# Patient Record
Sex: Female | Born: 1965
Health system: Southern US, Community
[De-identification: ages and names within clinical notes are randomized; demographics above are authoritative.]

## PROBLEM LIST (undated history)

## (undated) DIAGNOSIS — K219 Gastro-esophageal reflux disease without esophagitis: Secondary | ICD-10-CM

## (undated) DIAGNOSIS — Z1379 Encounter for other screening for genetic and chromosomal anomalies: Principal | ICD-10-CM

## (undated) DIAGNOSIS — E079 Disorder of thyroid, unspecified: Secondary | ICD-10-CM

## (undated) DIAGNOSIS — N6310 Unspecified lump in the right breast, unspecified quadrant: Secondary | ICD-10-CM

## (undated) DIAGNOSIS — E039 Hypothyroidism, unspecified: Secondary | ICD-10-CM

## (undated) HISTORY — PX: BREAST SURGERY: SHX581

## (undated) HISTORY — DX: Encounter for other screening for genetic and chromosomal anomalies: Z13.79

## (undated) HISTORY — PX: BREAST EXCISIONAL BIOPSY: SUR124

## (undated) HISTORY — DX: Disorder of thyroid, unspecified: E07.9

## (undated) HISTORY — PX: BREAST BIOPSY: SHX20

---

## 1999-06-01 ENCOUNTER — Encounter: Payer: Self-pay | Admitting: Obstetrics and Gynecology

## 1999-06-01 ENCOUNTER — Ambulatory Visit (HOSPITAL_COMMUNITY): Admission: RE | Admit: 1999-06-01 | Discharge: 1999-06-01 | Payer: Self-pay | Admitting: Obstetrics and Gynecology

## 1999-11-01 ENCOUNTER — Other Ambulatory Visit: Admission: RE | Admit: 1999-11-01 | Discharge: 1999-11-01 | Payer: Self-pay | Admitting: Obstetrics and Gynecology

## 2000-02-19 ENCOUNTER — Inpatient Hospital Stay (HOSPITAL_COMMUNITY): Admission: AD | Admit: 2000-02-19 | Discharge: 2000-02-19 | Payer: Self-pay | Admitting: Obstetrics and Gynecology

## 2000-03-02 ENCOUNTER — Ambulatory Visit (HOSPITAL_COMMUNITY): Admission: RE | Admit: 2000-03-02 | Discharge: 2000-03-02 | Payer: Self-pay | Admitting: Obstetrics and Gynecology

## 2000-05-19 ENCOUNTER — Inpatient Hospital Stay (HOSPITAL_COMMUNITY): Admission: AD | Admit: 2000-05-19 | Discharge: 2000-05-21 | Payer: Self-pay | Admitting: Obstetrics and Gynecology

## 2000-06-26 ENCOUNTER — Other Ambulatory Visit: Admission: RE | Admit: 2000-06-26 | Discharge: 2000-06-26 | Payer: Self-pay | Admitting: Obstetrics and Gynecology

## 2001-08-27 ENCOUNTER — Other Ambulatory Visit: Admission: RE | Admit: 2001-08-27 | Discharge: 2001-08-27 | Payer: Self-pay | Admitting: Obstetrics and Gynecology

## 2002-09-09 ENCOUNTER — Other Ambulatory Visit: Admission: RE | Admit: 2002-09-09 | Discharge: 2002-09-09 | Payer: Self-pay | Admitting: Obstetrics and Gynecology

## 2002-10-24 ENCOUNTER — Encounter: Admission: RE | Admit: 2002-10-24 | Discharge: 2002-10-24 | Payer: Self-pay | Admitting: Obstetrics and Gynecology

## 2002-10-24 ENCOUNTER — Encounter: Payer: Self-pay | Admitting: Obstetrics and Gynecology

## 2003-05-26 ENCOUNTER — Encounter: Admission: RE | Admit: 2003-05-26 | Discharge: 2003-05-26 | Payer: Self-pay | Admitting: Obstetrics and Gynecology

## 2003-05-26 ENCOUNTER — Encounter: Payer: Self-pay | Admitting: Obstetrics and Gynecology

## 2003-09-15 ENCOUNTER — Other Ambulatory Visit: Admission: RE | Admit: 2003-09-15 | Discharge: 2003-09-15 | Payer: Self-pay | Admitting: Obstetrics and Gynecology

## 2003-10-22 ENCOUNTER — Emergency Department (HOSPITAL_COMMUNITY): Admission: AD | Admit: 2003-10-22 | Discharge: 2003-10-22 | Payer: Self-pay | Admitting: Family Medicine

## 2003-12-22 ENCOUNTER — Encounter: Admission: RE | Admit: 2003-12-22 | Discharge: 2003-12-22 | Payer: Self-pay | Admitting: Obstetrics and Gynecology

## 2004-09-15 ENCOUNTER — Other Ambulatory Visit: Admission: RE | Admit: 2004-09-15 | Discharge: 2004-09-15 | Payer: Self-pay | Admitting: Obstetrics and Gynecology

## 2005-09-21 ENCOUNTER — Other Ambulatory Visit: Admission: RE | Admit: 2005-09-21 | Discharge: 2005-09-21 | Payer: Self-pay | Admitting: Obstetrics and Gynecology

## 2005-10-11 ENCOUNTER — Ambulatory Visit: Payer: Self-pay | Admitting: Internal Medicine

## 2006-06-02 ENCOUNTER — Encounter: Admission: RE | Admit: 2006-06-02 | Discharge: 2006-06-02 | Payer: Self-pay | Admitting: Obstetrics and Gynecology

## 2006-11-23 ENCOUNTER — Other Ambulatory Visit: Admission: RE | Admit: 2006-11-23 | Discharge: 2006-11-23 | Payer: Self-pay | Admitting: Obstetrics and Gynecology

## 2007-02-02 ENCOUNTER — Emergency Department (HOSPITAL_COMMUNITY): Admission: EM | Admit: 2007-02-02 | Discharge: 2007-02-02 | Payer: Self-pay | Admitting: Emergency Medicine

## 2007-06-18 ENCOUNTER — Ambulatory Visit: Payer: Self-pay | Admitting: Internal Medicine

## 2007-06-18 DIAGNOSIS — J019 Acute sinusitis, unspecified: Secondary | ICD-10-CM | POA: Insufficient documentation

## 2007-07-04 ENCOUNTER — Encounter: Admission: RE | Admit: 2007-07-04 | Discharge: 2007-07-04 | Payer: Self-pay | Admitting: Obstetrics and Gynecology

## 2007-10-06 ENCOUNTER — Emergency Department (HOSPITAL_COMMUNITY): Admission: EM | Admit: 2007-10-06 | Discharge: 2007-10-06 | Payer: Self-pay | Admitting: Emergency Medicine

## 2007-11-27 ENCOUNTER — Other Ambulatory Visit: Admission: RE | Admit: 2007-11-27 | Discharge: 2007-11-27 | Payer: Self-pay | Admitting: Obstetrics and Gynecology

## 2008-03-19 ENCOUNTER — Ambulatory Visit: Payer: Self-pay | Admitting: Internal Medicine

## 2008-03-19 DIAGNOSIS — R51 Headache: Secondary | ICD-10-CM | POA: Insufficient documentation

## 2008-03-19 DIAGNOSIS — R519 Headache, unspecified: Secondary | ICD-10-CM | POA: Insufficient documentation

## 2008-03-19 DIAGNOSIS — G4700 Insomnia, unspecified: Secondary | ICD-10-CM | POA: Insufficient documentation

## 2008-12-04 ENCOUNTER — Other Ambulatory Visit: Admission: RE | Admit: 2008-12-04 | Discharge: 2008-12-04 | Payer: Self-pay | Admitting: Obstetrics and Gynecology

## 2009-10-05 ENCOUNTER — Ambulatory Visit: Payer: Self-pay | Admitting: Internal Medicine

## 2009-10-05 DIAGNOSIS — J069 Acute upper respiratory infection, unspecified: Secondary | ICD-10-CM | POA: Insufficient documentation

## 2009-12-10 ENCOUNTER — Other Ambulatory Visit: Admission: RE | Admit: 2009-12-10 | Discharge: 2009-12-10 | Payer: Self-pay | Admitting: Obstetrics and Gynecology

## 2009-12-20 HISTORY — PX: OTHER SURGICAL HISTORY: SHX169

## 2010-04-30 ENCOUNTER — Encounter: Admission: RE | Admit: 2010-04-30 | Discharge: 2010-04-30 | Payer: Self-pay | Admitting: Obstetrics and Gynecology

## 2010-09-12 ENCOUNTER — Encounter: Payer: Self-pay | Admitting: Obstetrics and Gynecology

## 2010-09-23 NOTE — Assessment & Plan Note (Signed)
Summary: ?sinus inf/njr   Vital Signs:  Patient Profile:   45 Years Old Female Weight:      170 pounds Temp:     98.7 degrees F oral Pulse rate:   72 / minute BP sitting:   110 / 80  (right arm) Cuff size:   regular  Vitals Entered By: Romualdo Bolk, CMA (June 18, 2007 10:40 AM)                 Chief Complaint:  Sinus pressure and coughing and congestion x 1 week.  History of Present Illness: Christine Velez comes in today for above reason.   head cold that persists and worried about  getting bronchitis and pnuemonia.   No with face pressure continuing.   No fever.  Feels bad in the chest.  symptom about 10days.   No hx of asthma but hx of prolonged cough with uri inthe fall.  No cp or sob . Has face pressure and feels bad.  Worried about weight gain  had normal tsh at gyne  exercises  Current Allergies (reviewed today): No known allergies    Social History:    Reviewed history from 05/29/2007 and no changes required:       Married       Never Smoked       Alcohol use-yes       Drug use-no       Regular exercise-no    Review of Systems  The patient denies fever, chest pain, and dyspnea on exhertion.     Physical Exam  General:     alert, well-developed, well-nourished, and well-hydrated.   Eyes:     no redness Ears:     R ear normal and L ear normal.   Nose:     mucosal erythema.  cobblestining and some crusting face minimally tender. maxilla Mouth:     pharynx pink and moist.   Neck:     No deformities, masses, or tenderness noted. Lungs:     Normal respiratory effort, chest expands symmetrically. Lungs are clear to auscultation, no crackles or wheezes. Cervical Nodes:     No lymphadenopathy noted    Impression & Recommendations:  Problem # 1:  SINUSITIS- ACUTE-NOS (ICD-461.9) viral vs bacterial. discussed dx of bronchitis is a non specific dx and does not direct tretment. no evidence of pneumonia at present.  Rec pseudophedrine decong  with saline and can add antibiotic. Expect cought o continue for a while but cancall if getting worse. Her updated medication list for this problem includes:    Cefdinir 300 Mg Caps (Cefdinir) .Marland Kitchen... 1 by mouth two times a day  for sinusitis   Problem # 2:  concern about weight gain has normal bmi  reviewed  basic principals of weight maintenancee as one ages.  Complete Medication List: 1)  Loestrin Fe 1/20 1-20 Mg-mcg Tabs (Norethin ace-eth estrad-fe) 2)  Cefdinir 300 Mg Caps (Cefdinir) .Marland Kitchen.. 1 by mouth two times a day  for sinusitis     Prescriptions: CEFDINIR 300 MG  CAPS (CEFDINIR) 1 by mouth two times a day  for sinusitis  #20 x 0   Entered and Authorized by:   Madelin Headings MD   Signed by:   Madelin Headings MD on 06/18/2007   Method used:   Electronically sent to ...       [id number]       [address]  [place], [place]  [address]       Ph: [phone]       Fax: [phone]   RxID:   [id number]  ]

## 2010-09-23 NOTE — Assessment & Plan Note (Signed)
Summary: URI? // RS   Vital Signs:  Patient profile:   45 year old female Menstrual status:  regular LMP:     09/21/2009 Height:      71 inches Weight:      170 pounds BMI:     23.80 Temp:     99.1 degrees F oral Pulse rate:   88 / minute BP sitting:   120 / 80  (right arm) Cuff size:   regular  Vitals Entered By: Romualdo Bolk, CMA (AAMA) (October 05, 2009 10:47 AM) CC: Coughing, congestion, sinus pressure, exposed to flu and bronchitis by children. No fever that she is aware of. LMP (date): 09/21/2009 LMP - Character: light Menarche (age onset years): 13   Menses interval (days): 28 Menstrual flow (days): 3 Menstrual Status regular Enter LMP: 09/21/2009   History of Present Illness: Christine Velez comesin today for   for SDA for above. Last ov 2009 and since that time has been  Onset with cough and draiange.    No fever  no sob.   has tried OTC mucinex and claritin. Nyquil.  Cough and deep and dry and midchest hurt.   No ear pain and face pain.   Has been sick  about  6 days  ago.  kids have been sick .    remote hx of pneumonia  no soib.  Preventive Screening-Counseling & Management  Alcohol-Tobacco     Alcohol drinks/day: <1     Alcohol type: all     Smoking Status: never  Caffeine-Diet-Exercise     Caffeine use/day: 2     Does Patient Exercise: no  Current Medications (verified): 1)  Loestrin Fe 1/20 1-20 Mg-Mcg  Tabs (Norethin Ace-Eth Estrad-Fe) 2)  Ambien 10 Mg  Tabs (Zolpidem Tartrate) .... 1/2 To 1 By Mouth At Bedtime As Needed  Per Dr Thomasena Edis  Allergies (verified): No Known Drug Allergies  Past History:  Past medical, surgical, family and social histories (including risk factors) reviewed, and no changes noted (except as noted below).  Past Medical History: Unremarkable G2P2 Hx of pneuonia  Past Surgical History: Reviewed history from 03/19/2008 and no changes required. Breast Bx fibroadenoama  Past History:  Care  Management: Gynecology: [facility]ins  Family History: Reviewed history from 03/19/2008 and no changes required. Family History Diabetes 1st degree relative Family History of Prostate CA 1st degree relative <50 father  Neg for HAs   Social History: Reviewed history from 03/19/2008 and no changes required. Married Never Smoked Alcohol use-yes   minimal  Drug use-no Regular exercise-no  2 coffee in am   1 soda max  Caffeine use/day:  2  Review of Systems  The patient denies fever, weight loss, weight gain, decreased hearing, dyspnea on exertion, peripheral edema, prolonged cough, hemoptysis, abdominal pain, severe indigestion/heartburn, hematuria, transient blindness, difficulty walking, unusual weight change, abnormal bleeding, enlarged lymph nodes, and angioedema.    Physical Exam  General:  Well-developed,well-nourished,in no acute distress; alert,appropriate and cooperative throughout examination Head:  normocephalic and atraumatic.   Eyes:  vision grossly intact, pupils equal, and pupils round.   Ears:  R ear normal, L ear normal, and no external deformities.   Nose:  no external deformity and no external erythema.  congested   mild tenderness nasal bridge  Mouth:  pharynx pink and moist.   teeth in good repair Neck:  No deformities, masses, or tenderness noted. Lungs:  Normal respiratory effort, chest expands symmetrically. Lungs are clear to auscultation, no crackles or wheezes.no  dullness.   Heart:  Normal rate and regular rhythm. S1 and S2 normal without gallop, murmur, click, rub or other extra sounds.no lifts.   Pulses:  nl cap refill  Extremities:  no clubbing cyanosis or edema  Neurologic:  alert & oriented X3 and gait normal.   Skin:  turgor normal and color normal.   Cervical Nodes:  No lymphadenopathy noted   Impression & Recommendations:  Problem # 1:  UPPER RESPIRATORY INFECTION, ACUTE, WITH BRONCHITIS (ICD-465.9) Expectant management and disc alarm findings  to  recheck   Problem # 2:  SINUSITIS- ACUTE-NOS (ICD-461.9) take med if needed  may resolve on its own. Her updated medication list for this problem includes:    Cefdinir 300 Mg Caps (Cefdinir) .Marland Kitchen... 1 by mouth two times a day for sinusitis  Complete Medication List: 1)  Loestrin Fe 1/20 1-20 Mg-mcg Tabs (Norethin ace-eth estrad-fe) 2)  Ambien 10 Mg Tabs (Zolpidem tartrate) .... 1/2 to 1 by mouth at bedtime as needed  per dr Thomasena Edis 3)  Cefdinir 300 Mg Caps (Cefdinir) .Marland Kitchen.. 1 by mouth two times a day for sinusitis  Patient Instructions: 1)  Acute sinusitis symptoms for less than 10 days are not helped by antibiotics. Use warm moist compresses, and over the counter decongestants( only as directed). Call if no improvement in 5-7 days, sooner if increasing pain, fever, or new symptoms.  2)  Acute Bronchitis symptoms for less then 10 days are not  helped by antibiotics. Take over the counter cough medications. Call if no improvement in 5-7 days, sooner if increasing cough, fever, or new symptoms ( shortness of breath, chest pain) .  Prescriptions: CEFDINIR 300 MG CAPS (CEFDINIR) 1 by mouth two times a day for sinusitis  #20 x 0   Entered and Authorized by:   Madelin Headings MD   Signed by:   Madelin Headings MD on 10/05/2009   Method used:   Print then Give to Patient   RxID:   [id number]

## 2010-09-23 NOTE — Assessment & Plan Note (Signed)
Summary: Discuss headache problems/nn   Vital Signs:  Patient Profile:   45 Years Old Female Weight:      162 pounds Pulse rate:   78 / minute BP sitting:   110 / 70  (left arm) Cuff size:   regular  Vitals Entered By: Romualdo Bolk, CMA (March 19, 2008 1:43 PM)                 Chief Complaint:  Headache.  History of Present Illness: Jenene Kauffmann is here for SDA appt  for headaches x 3 days.   Pt states that she is not having any sensitivity to sound or noise. Pt is having some nausea.Yesterday in nasal bridge area and then back of head.     REmote hx of HA similar and last 1 days or so.  Marland Kitchen   PAin today  2/10  yesterday 8-9/10   .  took advil and sinutab 2   and then bc powder .  tylenol previously .  Onset after travel from beach and took [facility] powder     usually takes tylenol advil  .Takes   bc powders about 4 times per weeks. Tends to flare around period.  Recently eye doc said needs readers otherwise ok  .     ON ocps and  as needed ambien     4 am wakening s  treated by  Dr Thomasena Edis gyne.       Prior Medications Reviewed Using: Patient Recall  Updated Prior Medication List: LOESTRIN FE 1/20 1-20 MG-MCG  TABS (NORETHIN ACE-ETH ESTRAD-FE)  AMBIEN 10 MG  TABS (ZOLPIDEM TARTRATE) 1/2 to 1 by mouth at bedtime as needed  Current Allergies (reviewed today): No known allergies   Past Medical History:    Unremarkable    G2P2  Past Surgical History:    Breast Bx fibroadenoama   Family History:    Family History Diabetes 1st degree relative    Family History of Prostate CA 1st degree relative <50 father        Neg for HAs   Social History:    Married    Never Smoked    Alcohol use-yes   minimal     Drug use-no    Regular exercise-no     2 coffee in am   1 soda max     Review of Systems  The patient denies anorexia, weight loss, chest pain, dyspnea on exertion, prolonged cough, unusual weight change, and enlarged lymph nodes.         sleep, emotional  sensitivity this summer  ? not really depressed ?   unsure    Physical Exam  General:     alert, well-developed, and well-nourished.   Head:     normocephalic and atraumatic.   Eyes:     vision grossly intact, pupils equal, and pupils round.  eoms normal Ears:     R ear normal, L ear normal, and no external deformities.   Nose:     no external deformity, no external erythema, and no nasal discharge.   Mouth:     pharynx pink and moist and no erythema.   Neck:     No deformities, masses, or tenderness noted.thyroid np Lungs:     Normal respiratory effort, chest expands symmetrically. Lungs are clear to auscultation, no crackles or wheezes. Heart:     Normal rate and regular rhythm. S1 and S2 normal without gallop, murmur, click, rub or other extra sounds. Extremities:  no cce  Neurologic:     cn 3-12 intactalert & oriented X3, gait normal, DTRs symmetrical and normal, and Romberg negative.  heel toe normal Skin:     turgor normal and color normal.   Cervical Nodes:     No lymphadenopathy noted Psych:     normally interactive, good eye contact, and not anxious appearing.      Impression & Recommendations:  Problem # 1:  HEADACHE (ICD-784.0) Assessment: New seems like has baseline HA and now increasing   and  I have concern about rebound from meds   BCs especially    caffeine could be effecting sleep and causing also an insomnia cycle also.    counseled asbout plan...non focal exam today an no other alarm symptom .  she has been on ocps alll along so she does not see it contributing  at present.  Problem # 2:  SLEEPLESSNESS (ICD-780.52)  Her updated medication list for this problem includes:    Ambien 10 Mg Tabs (Zolpidem tartrate) .Marland Kitchen... 1/2 to 1 by mouth at bedtime as needed  per dr Thomasena Edis   Complete Medication List: 1)  Loestrin Fe 1/20 1-20 Mg-mcg Tabs (Norethin ace-eth estrad-fe) 2)  Ambien 10 Mg Tabs (Zolpidem tartrate) .... 1/2 to 1 by mouth at bedtime as  needed  per dr Thomasena Edis   Patient Instructions: 1)  HA   calendar   2)  limit caffeine   and avoid the BCs for now 3)  For HA Take 800 mg of ibuprofen up to every 8 hours . 4)  rov in 1 month or call for HA referral.     ]

## 2011-01-12 ENCOUNTER — Other Ambulatory Visit: Payer: Self-pay | Admitting: Otolaryngology

## 2011-01-19 ENCOUNTER — Ambulatory Visit
Admission: RE | Admit: 2011-01-19 | Discharge: 2011-01-19 | Disposition: A | Discharge status: Home / Self Care | Payer: BC Managed Care – PPO | Source: Ambulatory Visit | Attending: Otolaryngology | Admitting: Otolaryngology

## 2011-03-30 ENCOUNTER — Other Ambulatory Visit: Payer: Self-pay | Admitting: Obstetrics and Gynecology

## 2011-03-30 DIAGNOSIS — Z1231 Encounter for screening mammogram for malignant neoplasm of breast: Secondary | ICD-10-CM

## 2011-05-04 ENCOUNTER — Ambulatory Visit: Payer: BC Managed Care – PPO

## 2011-05-13 LAB — POCT RAPID STREP A: Streptococcus, Group A Screen (Direct): NEGATIVE

## 2011-05-13 LAB — INFLUENZA A AND B ANTIGEN (CONVERTED LAB)
Inflenza A Ag: NEGATIVE
Influenza B Ag: NEGATIVE

## 2011-06-09 LAB — POCT RAPID STREP A: Streptococcus, Group A Screen (Direct): NEGATIVE

## 2011-11-30 ENCOUNTER — Ambulatory Visit
Admission: RE | Admit: 2011-11-30 | Discharge: 2011-11-30 | Disposition: A | Discharge status: Home / Self Care | Payer: BC Managed Care – PPO | Source: Ambulatory Visit | Attending: Obstetrics and Gynecology | Admitting: Obstetrics and Gynecology

## 2011-11-30 DIAGNOSIS — Z1231 Encounter for screening mammogram for malignant neoplasm of breast: Secondary | ICD-10-CM

## 2011-12-22 ENCOUNTER — Other Ambulatory Visit: Payer: Self-pay | Admitting: Nurse Practitioner

## 2011-12-22 ENCOUNTER — Other Ambulatory Visit (HOSPITAL_COMMUNITY)
Admission: RE | Admit: 2011-12-22 | Discharge: 2011-12-22 | Disposition: A | Discharge status: Home / Self Care | Payer: BC Managed Care – PPO | Source: Ambulatory Visit | Attending: Obstetrics and Gynecology | Admitting: Obstetrics and Gynecology

## 2011-12-22 DIAGNOSIS — Z01419 Encounter for gynecological examination (general) (routine) without abnormal findings: Secondary | ICD-10-CM | POA: Insufficient documentation

## 2013-02-21 ENCOUNTER — Other Ambulatory Visit: Payer: Self-pay

## 2013-02-21 DIAGNOSIS — Z1231 Encounter for screening mammogram for malignant neoplasm of breast: Secondary | ICD-10-CM

## 2013-03-15 ENCOUNTER — Ambulatory Visit
Admission: RE | Admit: 2013-03-15 | Discharge: 2013-03-15 | Disposition: A | Discharge status: Home / Self Care | Payer: BC Managed Care – PPO | Source: Ambulatory Visit

## 2013-03-15 DIAGNOSIS — Z1231 Encounter for screening mammogram for malignant neoplasm of breast: Secondary | ICD-10-CM

## 2013-10-10 ENCOUNTER — Telehealth: Payer: Self-pay | Admitting: Internal Medicine

## 2013-10-10 NOTE — Telephone Encounter (Signed)
Doesn't sound like a single acute problem based on message  30 minute appt. Can use the [facility] at 845 on Tuesday   Feb 24th

## 2013-10-10 NOTE — Telephone Encounter (Signed)
Pt not seen since 10/05/09. Pt needs to re-est as a new pt.. But needs to be seen asap for acute issue.  Pt has aching in lower back, legs, trouble sleeping. Not had period in 3 months pt does not have an obgyn. Not feeling well No 30 min appt anytime soon. I suppose you will need 30 min? Is it ok to schedule?

## 2013-10-11 NOTE — Telephone Encounter (Signed)
Scheduled

## 2013-10-15 ENCOUNTER — Ambulatory Visit: Payer: BC Managed Care – PPO | Admitting: Internal Medicine

## 2013-10-23 ENCOUNTER — Other Ambulatory Visit: Payer: Self-pay | Admitting: Family Medicine

## 2013-10-23 ENCOUNTER — Ambulatory Visit
Admission: RE | Admit: 2013-10-23 | Discharge: 2013-10-23 | Disposition: A | Discharge status: Home / Self Care | Payer: BC Managed Care – PPO | Source: Ambulatory Visit | Attending: Family Medicine | Admitting: Family Medicine

## 2013-10-23 DIAGNOSIS — R059 Cough, unspecified: Secondary | ICD-10-CM

## 2013-10-23 DIAGNOSIS — R05 Cough: Secondary | ICD-10-CM

## 2013-10-29 ENCOUNTER — Ambulatory Visit: Payer: BC Managed Care – PPO | Admitting: Internal Medicine

## 2013-12-18 ENCOUNTER — Ambulatory Visit: Payer: BC Managed Care – PPO | Admitting: Internal Medicine

## 2013-12-20 HISTORY — PX: LUMBAR DISC SURGERY: SHX700

## 2014-08-22 HISTORY — PX: BREAST EXCISIONAL BIOPSY: SUR124

## 2014-10-01 ENCOUNTER — Telehealth: Payer: Self-pay | Admitting: Family Medicine

## 2014-10-01 NOTE — Telephone Encounter (Signed)
Pt has an appt with dr balan endocrinologist tomorrow. Pt would like to know does she still need to see dr Fabian Sharppanosh

## 2014-10-01 NOTE — Telephone Encounter (Signed)
[facility] needs to come in for a follow up to discuss lab work done at Dr. Kenna GilbertMann's office.  Please help the pt to get an appt.  Thanks!

## 2014-10-02 NOTE — Telephone Encounter (Signed)
I havent  seen her since 2011  She  Still needs to reestab lish as a new patient  if she wants me to be her PCP   But can delay . The appt .  She can schedule this at any time.

## 2014-10-02 NOTE — Telephone Encounter (Signed)
Pt will call back to sch.

## 2014-10-20 ENCOUNTER — Encounter (HOSPITAL_COMMUNITY): Payer: Self-pay

## 2014-10-20 ENCOUNTER — Emergency Department (HOSPITAL_COMMUNITY)
Admission: EM | Admit: 2014-10-20 | Discharge: 2014-10-20 | Disposition: A | Discharge status: Home / Self Care | Payer: BLUE CROSS/BLUE SHIELD | Attending: Emergency Medicine | Admitting: Emergency Medicine

## 2014-10-20 ENCOUNTER — Emergency Department (HOSPITAL_COMMUNITY): Payer: BLUE CROSS/BLUE SHIELD

## 2014-10-20 DIAGNOSIS — R1012 Left upper quadrant pain: Secondary | ICD-10-CM | POA: Insufficient documentation

## 2014-10-20 DIAGNOSIS — R110 Nausea: Secondary | ICD-10-CM | POA: Insufficient documentation

## 2014-10-20 DIAGNOSIS — R197 Diarrhea, unspecified: Secondary | ICD-10-CM | POA: Diagnosis not present

## 2014-10-20 LAB — COMPREHENSIVE METABOLIC PANEL
ALT: 16 U/L (ref 0–35)
AST: 19 U/L (ref 0–37)
Albumin: 4.1 g/dL (ref 3.5–5.2)
Alkaline Phosphatase: 60 U/L (ref 39–117)
Anion gap: 10 (ref 5–15)
BUN: 13 mg/dL (ref 6–23)
CO2: 22 mmol/L (ref 19–32)
Calcium: 9 mg/dL (ref 8.4–10.5)
Chloride: 105 mmol/L (ref 96–112)
Creatinine, Ser: 0.89 mg/dL (ref 0.50–1.10)
GFR calc Af Amer: 87 mL/min — ABNORMAL LOW (ref >90)
GFR calc non Af Amer: 75 mL/min — ABNORMAL LOW (ref >90)
Glucose, Bld: 129 mg/dL — ABNORMAL HIGH (ref 70–99)
Potassium: 4 mmol/L (ref 3.5–5.1)
Sodium: 137 mmol/L (ref 135–145)
Total Bilirubin: 0.4 mg/dL (ref 0.3–1.2)
Total Protein: 7.1 g/dL (ref 6.0–8.3)

## 2014-10-20 LAB — CBC WITH DIFFERENTIAL/PLATELET
Basophils Absolute: 0 10*3/uL (ref 0.0–0.1)
Basophils Relative: 0 % (ref 0–1)
Eosinophils Absolute: 0.3 10*3/uL (ref 0.0–0.7)
Eosinophils Relative: 3 % (ref 0–5)
HCT: 42 % (ref 36.0–46.0)
Hemoglobin: 14.2 g/dL (ref 12.0–15.0)
Lymphocytes Relative: 22 % (ref 12–46)
Lymphs Abs: 2 10*3/uL (ref 0.7–4.0)
MCH: 30.5 pg (ref 26.0–34.0)
MCHC: 33.8 g/dL (ref 30.0–36.0)
MCV: 90.1 fL (ref 78.0–100.0)
Monocytes Absolute: 0.8 10*3/uL (ref 0.1–1.0)
Monocytes Relative: 9 % (ref 3–12)
Neutro Abs: 5.8 10*3/uL (ref 1.7–7.7)
Neutrophils Relative %: 66 % (ref 43–77)
Platelets: 247 10*3/uL (ref 150–400)
RBC: 4.66 MIL/uL (ref 3.87–5.11)
RDW: 13 % (ref 11.5–15.5)
WBC: 8.9 10*3/uL (ref 4.0–10.5)

## 2014-10-20 LAB — URINALYSIS, ROUTINE W REFLEX MICROSCOPIC
Bilirubin Urine: NEGATIVE
Glucose, UA: NEGATIVE mg/dL
Hgb urine dipstick: NEGATIVE
Ketones, ur: NEGATIVE mg/dL
Leukocytes, UA: NEGATIVE
Nitrite: NEGATIVE
Protein, ur: NEGATIVE mg/dL
Specific Gravity, Urine: 1.021 (ref 1.005–1.030)
Urobilinogen, UA: 0.2 mg/dL (ref 0.0–1.0)
pH: 6 (ref 5.0–8.0)

## 2014-10-20 LAB — I-STAT BETA HCG BLOOD, ED (MC, WL, AP ONLY): I-stat hCG, quantitative: <5 m[IU]/mL (ref <5)

## 2014-10-20 LAB — LIPASE, BLOOD: Lipase: 28 U/L (ref 11–59)

## 2014-10-20 MED ORDER — ONDANSETRON HCL 4 MG/2ML IJ SOLN
4.0000 mg | Freq: Once | INTRAMUSCULAR | Status: AC
  Administered 2014-10-20: 4 mg via INTRAVENOUS
  Filled 2014-10-20: qty 2

## 2014-10-20 MED ORDER — IOHEXOL 300 MG/ML  SOLN
100.0000 mL | Freq: Once | INTRAMUSCULAR | Status: AC | PRN
  Administered 2014-10-20: 100 mL via INTRAVENOUS

## 2014-10-20 MED ORDER — MORPHINE SULFATE 4 MG/ML IJ SOLN
4.0000 mg | Freq: Once | INTRAMUSCULAR | Status: AC
  Administered 2014-10-20: 4 mg via INTRAVENOUS
  Filled 2014-10-20: qty 1

## 2014-10-20 NOTE — ED Notes (Signed)
Patient transported to CT 

## 2014-10-20 NOTE — Discharge Instructions (Signed)

## 2014-10-20 NOTE — ED Provider Notes (Signed)
CSN: [id number]     Arrival date & time 10/20/14  1452 History   First MD Initiated Contact with Patient 10/20/14 1622     Chief Complaint  Patient presents with  . Abdominal Pain  . Flank Pain      Patient is a 49 y.o. female presenting with abdominal pain and flank pain. The history is provided by the patient.  Abdominal Pain Pain location:  LUQ Pain quality: cramping   Pain radiates to:  Does not radiate Pain severity:  Severe Onset quality:  Sudden Duration: several hours ago. Timing:  Constant Progression:  Worsening Chronicity:  Recurrent Relieved by:  Nothing Worsened by:  Nothing tried Associated symptoms: diarrhea and nausea   Associated symptoms: no chest pain, no dysuria, no fever, no hematochezia, no shortness of breath, no vaginal bleeding, no vaginal discharge and no vomiting   Flank Pain Associated symptoms include abdominal pain. Pertinent negatives include no chest pain and no shortness of breath.  pt reports h/o abdominal "churning" and pain since Thanksgiving 2015 She has seen GI specialist (Dr Loreta AveMann) and has labs performed but no diagnosis has been made. She has even tried oral antibiotics without improvement Today she had onset of LUQ pain that is worse than previous episodes   PMH - none Surg hx - no abdominal surgery  History  Substance Use Topics  . Smoking status: Never Smoker   . Smokeless tobacco: Not on file  . Alcohol Use: No   OB History    No data available     Review of Systems  Constitutional: Negative for fever.  Respiratory: Negative for shortness of breath.   Cardiovascular: Negative for chest pain.  Gastrointestinal: Positive for nausea, abdominal pain and diarrhea. Negative for vomiting, blood in stool and hematochezia.  Genitourinary: Positive for flank pain. Negative for dysuria, vaginal bleeding and vaginal discharge.  All other systems reviewed and are negative.     Allergies  Review of patient's allergies indicates no  known allergies.  Home Medications   Prior to Admission medications   Not on File   BP 111/60 mmHg  Pulse 79  Temp(Src) 98.8 F (37.1 C) (Oral)  Resp 26  Ht 5\' 11"  (1.803 m)  Wt 195 lb (88.451 kg)  BMI 27.21 kg/m2  SpO2 100% Physical Exam CONSTITUTIONAL: Well developed/well nourished HEAD: Normocephalic/atraumatic EYES: EOMI/PERRL ENMT: Mucous membranes moist NECK: supple no meningeal signs SPINE/BACK:entire spine nontender CV: S1/S2 noted, no murmurs/rubs/gallops noted LUNGS: Lungs are clear to auscultation bilaterally, no apparent distress ABDOMEN: soft, moderate LUQ tenderness, no rebound or guarding, bowel sounds noted throughout abdomen GU:no cva tenderness NEURO: Pt is awake/alert/appropriate, moves all extremitiesx4.  No facial droop.   EXTREMITIES: pulses normal/equal, full ROM SKIN: warm, color normal PSYCH: mildly anxious  ED Course  Procedures   5:01 PM Will obtain CT imaging due to abdominal tenderness Pt agreeable with plan 7:11 PM CT scan negative Pt is well appearing, no distress, resting comfortably Discussed need for f/u We discussed strict return precautions Pt agreeable with plan  Labs Review Labs Reviewed  COMPREHENSIVE METABOLIC PANEL - Abnormal; Notable for the following:    Glucose, Bld 129 (*)    GFR calc non Af Amer 75 (*)    GFR calc Af Amer 87 (*)    All other components within normal limits  LIPASE, BLOOD  CBC WITH DIFFERENTIAL/PLATELET  URINALYSIS, ROUTINE W REFLEX MICROSCOPIC  I-STAT BETA HCG BLOOD, ED ([facility], [facility], [facility] ONLY)    Imaging Review Ct Abdomen  Pelvis W Contrast  10/20/2014   CLINICAL DATA:  Severe left lower quadrant pain today, pelvic pain for 3 weeks  EXAM: CT ABDOMEN AND PELVIS WITH CONTRAST  TECHNIQUE: Multidetector CT imaging of the abdomen and pelvis was performed using the standard protocol following bolus administration of intravenous contrast.  CONTRAST:  OMNIPAQUE IOHEXOL 300 MG/ML  SOLN  COMPARISON:  None.   FINDINGS: Sagittal images of the spine are unremarkable. Lung bases are unremarkable.  Enhanced liver shows no biliary ductal dilatation. A cyst noted in left hepatic dome measures 9 mm. No solid hepatic mass. No calcified gallstones are noted within gallbladder. The pancreas, spleen and adrenal glands are unremarkable. Kidneys are symmetrical in size and enhancement. No hydronephrosis or hydroureter. Delayed renal images shows bilateral renal symmetrical excretion. There is a cyst in lower pole of the left kidney measures 7 mm. Bilateral visualized ureter is unremarkable. Urinary bladder is unremarkable.  There is retroflexed uterus.  No adnexal masses noted.  Moderate stool noted in right colon. Normal retrocecal appendix. The terminal ileum is unremarkable.  No small bowel obstruction.  No ascites or free air.  No adenopathy.  There is no colitis or diverticulitis.  No inguinal adenopathy.  IMPRESSION: 1. No small bowel obstruction.  No ascites or free air. 2. No hydronephrosis or hydroureter. Bilateral renal symmetrical excretion. 3. Moderate stool noted in right colon and cecum. No pericecal inflammation. Normal appendix. 4. Retroflexed uterus.  No adnexal mass.   Electronically Signed   By: Natasha Mead M.D.   On: 10/20/2014 18:58     Medications  morphine 4 MG/ML injection 4 mg (4 mg Intravenous Given 10/20/14 1651)  ondansetron (ZOFRAN) injection 4 mg (4 mg Intravenous Given 10/20/14 1651)    MDM   Final diagnoses:  Left upper quadrant pain    Nursing notes including past medical history and social history reviewed and considered in documentation Labs/vital reviewed myself and considered during evaluation     Joya Gaskins, MD 10/20/14 1911

## 2014-10-20 NOTE — ED Notes (Signed)
Pt here for left flank and abd pain, for several weeks but worse today, hyperventiliating in triage. No hx of kidney stones.

## 2014-10-21 ENCOUNTER — Ambulatory Visit
Admission: RE | Admit: 2014-10-21 | Discharge: 2014-10-21 | Disposition: A | Discharge status: Home / Self Care | Payer: Self-pay | Source: Ambulatory Visit | Attending: Gastroenterology | Admitting: Gastroenterology

## 2014-10-21 ENCOUNTER — Ambulatory Visit
Admission: RE | Admit: 2014-10-21 | Discharge: 2014-10-21 | Disposition: A | Discharge status: Home / Self Care | Payer: BLUE CROSS/BLUE SHIELD | Source: Ambulatory Visit | Attending: Gastroenterology | Admitting: Gastroenterology

## 2014-10-21 ENCOUNTER — Other Ambulatory Visit: Payer: Self-pay | Admitting: Gastroenterology

## 2014-10-21 DIAGNOSIS — R1084 Generalized abdominal pain: Secondary | ICD-10-CM

## 2014-11-04 ENCOUNTER — Telehealth: Payer: Self-pay | Admitting: Family Medicine

## 2014-11-04 NOTE — Telephone Encounter (Signed)
Received a faxed office note from Dr. Loreta AveMann.  Pt had an abnormal TSH.  Per [facility], pt needs to be scheduled.  Please help the pt make an appt.  She will be a new pt to re establish.  Needs 30 minutes.  Thanks!

## 2014-11-04 NOTE — Telephone Encounter (Signed)
Pt was called and does not want to re-est right now

## 2014-12-03 ENCOUNTER — Encounter: Payer: Self-pay | Admitting: Obstetrics and Gynecology

## 2014-12-03 ENCOUNTER — Ambulatory Visit (INDEPENDENT_AMBULATORY_CARE_PROVIDER_SITE_OTHER): Payer: BLUE CROSS/BLUE SHIELD | Admitting: Obstetrics and Gynecology

## 2014-12-03 VITALS — BP 104/60 | HR 70 | Resp 20 | Ht 70.25 in | Wt 198.4 lb

## 2014-12-03 DIAGNOSIS — Z23 Encounter for immunization: Secondary | ICD-10-CM

## 2014-12-03 DIAGNOSIS — Z0000 Encounter for general adult medical examination without abnormal findings: Secondary | ICD-10-CM | POA: Diagnosis not present

## 2014-12-03 DIAGNOSIS — Z01419 Encounter for gynecological examination (general) (routine) without abnormal findings: Secondary | ICD-10-CM

## 2014-12-03 LAB — POCT URINALYSIS DIPSTICK
Bilirubin, UA: NEGATIVE
Blood, UA: NEGATIVE
Ketones, UA: NEGATIVE
Leukocytes, UA: NEGATIVE
Nitrite, UA: NEGATIVE
Protein, UA: NEGATIVE
Urobilinogen, UA: NEGATIVE
pH, UA: 5

## 2014-12-03 NOTE — Progress Notes (Signed)
Patient ID: Christine Velez, female   DOB: October 26, 1965, 49 y.o.   MRN: [id number] 49 y.o. G2P2 MarriedCaucasianF here for annual exam.    Skipped menses for 6 months, and then they returned.  Not sure if having hot flashes.   Has gained weight.  Had back surgery last year.   Seen by Dr. Collene Mares recently for GI issues.  Had colonoscopy and endoscopy - colon polyp.   Works for [employer].  2 children - Sr in high school and one in 8th grade.   PCP:  None  Patient's last menstrual period was 11/21/2014 (exact date).          Sexually active: Yes.   female partner The current method of family planning is none.    Exercising: Yes.    walking. Smoker:  no  Health Maintenance: Pap:  18 months SPQ:ZRAQTM History of abnormal Pap:  no MMG:  03-15-13 dense/nl:[facility] Colonoscopy:  10/2014 one polyp with Dr. Collene Mares. Next due 10/2019. BMD:   n/a TDaP:  Unsure(would like one today) Screening Labs:   Hb today: with Dr.Mann, Urine today: Neg   reports that she has never smoked. She does not have any smokeless tobacco history on file. She reports that she drinks about 0.6 oz of alcohol per week. She reports that she does not use illicit drugs.  Past Medical History  Diagnosis Date  . Thyroid disease     sees Dr. Chalmers Cater    Past Surgical History  Procedure Laterality Date  . Lumbar disc surgery  12/2013    --Dr. Sherley Bounds  . Breast surgery      age 85--benign breast mass removed  . Lipoma removal  12/2009    left groin area--Dr. Trina Ao    Current Outpatient Prescriptions  Medication Sig Dispense Refill  . levothyroxine (SYNTHROID, LEVOTHROID) 50 MCG tablet Take 50 mcg by mouth daily before breakfast.     No current facility-administered medications for this visit.    Family History  Problem Relation Age of Onset  . Cancer Father 12    Dec-cancer unknown  origin  . Diabetes Father     AODM  . Cancer Paternal Grandmother     Dec--multiple myeloma  .  Heart attack Maternal Grandfather     ROS:  Pertinent items are noted in HPI.  Otherwise, a comprehensive ROS was negative.  Exam:   BP 104/60 mmHg  Pulse 70  Resp 20  Ht 5' 10.25" (1.784 m)  Wt 198 lb 6.4 oz (89.994 kg)  BMI 28.28 kg/m2  LMP 11/21/2014 (Exact Date)     Height: 5' 10.25" (178.4 cm)  Ht Readings from Last 3 Encounters:  12/03/14 5' 10.25" (1.784 m)  10/20/14 _0  (1.803 m)  10/05/09 _1  (1.803 m)    General appearance: alert, cooperative and appears stated age Head: Normocephalic, without obvious abnormality, atraumatic Neck: no adenopathy, supple, symmetrical, trachea midline and thyroid normal to inspection and palpation Lungs: clear to auscultation bilaterally Breasts: normal appearance, no masses or tenderness, Inspection negative, No nipple retraction or dimpling, No nipple discharge or bleeding, No axillary or supraclavicular adenopathy Heart: regular rate and rhythm Abdomen: soft, non-tender; bowel sounds normal; no masses,  no organomegaly Extremities: extremities normal, atraumatic, no cyanosis or edema Skin: Skin color, texture, turgor normal. No rashes or lesions Lymph nodes: Cervical, supraclavicular, and axillary nodes normal. No abnormal inguinal nodes palpated Neurologic: Grossly normal   Pelvic: External genitalia:  no lesions  Urethra:  normal appearing urethra with no masses, tenderness or lesions              Bartholins and Skenes: normal                 Vagina: normal appearing vagina with normal color and discharge, no lesions              Cervix: no lesions              Pap taken:  Yes. Bimanual Exam:  Uterus:  normal size, contour, position, consistency, mobility, non-tender              Adnexa: normal adnexa and no mass, fullness, tenderness               Rectovaginal: Confirms               Anus:  normal sphincter tone, no lesions  Chaperone was present for exam.  A:  Well Woman with normal exam Perimenopausal  female.  Weight gain.  Hypothyroidism managed through Dr. Chalmers Cater.   P:   Mammogram recommended. pap smear performed.  Discussed contraception.  Patient declines.  TDap. Discussed weight loss through diet and exercise.  Synthroid by endocrinology.  return annually or prn

## 2014-12-03 NOTE — Patient Instructions (Signed)
EXERCISE AND DIET:  We recommended that you start or continue a regular exercise program for good health. Regular exercise means any activity that makes your heart beat faster and makes you sweat.  We recommend exercising at least 30 minutes per day at least 3 days a week, preferably 4 or 5.  We also recommend a diet low in fat and sugar.  Inactivity, poor dietary choices and obesity can cause diabetes, heart attack, stroke, and kidney damage, among others.    ALCOHOL AND SMOKING:  Women should limit their alcohol intake to no more than 7 drinks/beers/glasses of wine (combined, not each!) per week. Moderation of alcohol intake to this level decreases your risk of breast cancer and liver damage. And of course, no recreational drugs are part of a healthy lifestyle.  And absolutely no smoking or even second hand smoke. Most people know smoking can cause heart and lung diseases, but did you know it also contributes to weakening of your bones? Aging of your skin?  Yellowing of your teeth and nails?  CALCIUM AND VITAMIN D:  Adequate intake of calcium and Vitamin D are recommended.  The recommendations for exact amounts of these supplements seem to change often, but generally speaking 600 mg of calcium (either carbonate or citrate) and 800 units of Vitamin D per day seems prudent. Certain women may benefit from higher intake of Vitamin D.  If you are among these women, your doctor will have told you during your visit.    PAP SMEARS:  Pap smears, to check for cervical cancer or precancers,  have traditionally been done yearly, although recent scientific advances have shown that most women can have pap smears less often.  However, every woman still should have a physical exam from her gynecologist every year. It will include a breast check, inspection of the vulva and vagina to check for abnormal growths or skin changes, a visual exam of the cervix, and then an exam to evaluate the size and shape of the uterus and  ovaries.  And after 49 years of age, a rectal exam is indicated to check for rectal cancers. We will also provide age appropriate advice regarding health maintenance, like when you should have certain vaccines, screening for sexually transmitted diseases, bone density testing, colonoscopy, mammograms, etc.   MAMMOGRAMS:  All women over 40 years old should have a yearly mammogram. Many facilities now offer a "3D" mammogram, which may cost around $50 extra out of pocket. If possible,  we recommend you accept the option to have the 3D mammogram performed.  It both reduces the number of women who will be called back for extra views which then turn out to be normal, and it is better than the routine mammogram at detecting truly abnormal areas.    COLONOSCOPY:  Colonoscopy to screen for colon cancer is recommended for all women at age 50.  We know, you hate the idea of the prep.  We agree, BUT, having colon cancer and not knowing it is worse!!  Colon cancer so often starts as a polyp that can be seen and removed at colonscopy, which can quite literally save your life!  And if your first colonoscopy is normal and you have no family history of colon cancer, most women don't have to have it again for 10 years.  Once every ten years, you can do something that may end up saving your life, right?  We will be happy to help you get it scheduled when you are ready.    Be sure to check your insurance coverage so you understand how much it will cost.  It may be covered as a preventative service at no cost, but you should check your particular policy.     Exercise to Lose Weight Exercise and a healthy diet may help you lose weight. Your doctor may suggest specific exercises. EXERCISE IDEAS AND TIPS  Choose low-cost things you enjoy doing, such as walking, bicycling, or exercising to workout videos.  Take stairs instead of the elevator.  Walk during your lunch break.  Park your car further away from work or  school.  Go to a gym or an exercise class.  Start with 5 to 10 minutes of exercise each day. Build up to 30 minutes of exercise 4 to 6 days a week.  Wear shoes with good support and comfortable clothes.  Stretch before and after working out.  Work out until you breathe harder and your heart beats faster.  Drink extra water when you exercise.  Do not do so much that you hurt yourself, feel dizzy, or get very short of breath. Exercises that burn about 150 calories:  Running 1  miles in 15 minutes.  Playing volleyball for 45 to 60 minutes.  Washing and waxing a car for 45 to 60 minutes.  Playing touch football for 45 minutes.  Walking 1  miles in 35 minutes.  Pushing a stroller 1  miles in 30 minutes.  Playing basketball for 30 minutes.  Raking leaves for 30 minutes.  Bicycling 5 miles in 30 minutes.  Walking 2 miles in 30 minutes.  Dancing for 30 minutes.  Shoveling snow for 15 minutes.  Swimming laps for 20 minutes.  Walking up stairs for 15 minutes.  Bicycling 4 miles in 15 minutes.  Gardening for 30 to 45 minutes.  Jumping rope for 15 minutes.  Washing windows or floors for 45 to 60 minutes. Document Released: 09/10/2010 Document Revised: 10/31/2011 Document Reviewed: 09/10/2010 ExitCare Patient Information 2015 [employer]. This information is not intended to replace advice given to you by your health care provider. Make sure you discuss any questions you have with your health care provider.  

## 2014-12-05 LAB — IPS PAP TEST WITH HPV

## 2014-12-17 ENCOUNTER — Other Ambulatory Visit: Payer: Self-pay

## 2014-12-17 DIAGNOSIS — Z1231 Encounter for screening mammogram for malignant neoplasm of breast: Secondary | ICD-10-CM

## 2015-01-08 ENCOUNTER — Ambulatory Visit: Payer: BLUE CROSS/BLUE SHIELD

## 2015-01-09 ENCOUNTER — Ambulatory Visit
Admission: RE | Admit: 2015-01-09 | Discharge: 2015-01-09 | Disposition: A | Discharge status: Home / Self Care | Payer: BLUE CROSS/BLUE SHIELD | Source: Ambulatory Visit

## 2015-01-09 DIAGNOSIS — Z1231 Encounter for screening mammogram for malignant neoplasm of breast: Secondary | ICD-10-CM

## 2015-01-12 ENCOUNTER — Other Ambulatory Visit: Payer: Self-pay | Admitting: Obstetrics and Gynecology

## 2015-01-12 DIAGNOSIS — R928 Other abnormal and inconclusive findings on diagnostic imaging of breast: Secondary | ICD-10-CM

## 2015-01-13 ENCOUNTER — Ambulatory Visit
Admission: RE | Admit: 2015-01-13 | Discharge: 2015-01-13 | Disposition: A | Discharge status: Home / Self Care | Payer: BLUE CROSS/BLUE SHIELD | Source: Ambulatory Visit | Attending: Obstetrics and Gynecology | Admitting: Obstetrics and Gynecology

## 2015-01-13 ENCOUNTER — Other Ambulatory Visit: Payer: Self-pay | Admitting: Obstetrics and Gynecology

## 2015-01-13 ENCOUNTER — Telehealth: Payer: Self-pay | Admitting: Obstetrics and Gynecology

## 2015-01-13 DIAGNOSIS — R928 Other abnormal and inconclusive findings on diagnostic imaging of breast: Secondary | ICD-10-CM

## 2015-01-13 MED ORDER — KETOROLAC TROMETHAMINE 10 MG PO TABS: 10.0000 mg | ORAL_TABLET | Freq: Four times a day (QID) | ORAL | Status: DC | PRN

## 2015-01-13 NOTE — Telephone Encounter (Signed)
Patient calling with questions for the nurse about an Rx to help with her "anxiety" while she is going through follow up for breast problems.

## 2015-01-13 NOTE — Telephone Encounter (Signed)
Spoke with patient. Advised of message as seen below. Patient states "I really need something for my anxiety as well. Is there something I could take until the appointment to help with this because that other medicine is mainly for pain." Advised I will speak with Dr.Miller regarding medication for anxiety and return call.

## 2015-01-13 NOTE — Telephone Encounter (Signed)
Spoke with patient. Patient had screening mammogram performed on 01/09/2015 at [facility]. Returned for right breast diagnostic and ultrasound today 01/13/2015. Please see results in EPIC and copied below from screening mammogram. Patient is now scheduled for a right breast biopsy on 01/20/2015. Patient is very anxious about having the procedure and getting the results. "I have never done well with having a mammogram. It is painful and makes me very anxious. My previous GYN used to give me a couple of pills to help with the anxiety and pain when I went in to have the mammogram. I still have the prescription but it is from 2011 and has expired." Previous rx given was Ketorolac. Patient is asking for another medication to reduce her anxiety and discomfort for the biopsy on 5/31 and to help get her through the weekend due to being so worried. Advised I will speak with Dr.Miller as Dr.Silva is out of the office today and return call with further recommendations. Patient is agreeable.

## 2015-01-13 NOTE — Telephone Encounter (Signed)
Rx for Ketorolac 10mg  every 6 hrs as needed sent to pharmacy on file.  #20/0RF

## 2015-01-14 NOTE — Telephone Encounter (Signed)
Left message to call [name] at [phone]. 

## 2015-01-14 NOTE — Telephone Encounter (Signed)
Yes.  I can order some Xanax for her or Valium.  She may want someone to drive her.  If she has taken any specific in the past that helped, it would be helpful to know that.  Thanks.

## 2015-01-15 ENCOUNTER — Other Ambulatory Visit: Payer: Self-pay | Admitting: Obstetrics and Gynecology

## 2015-01-15 DIAGNOSIS — R928 Other abnormal and inconclusive findings on diagnostic imaging of breast: Secondary | ICD-10-CM

## 2015-01-20 ENCOUNTER — Ambulatory Visit
Admission: RE | Admit: 2015-01-20 | Discharge: 2015-01-20 | Disposition: A | Discharge status: Home / Self Care | Payer: BLUE CROSS/BLUE SHIELD | Source: Ambulatory Visit | Attending: Obstetrics and Gynecology | Admitting: Obstetrics and Gynecology

## 2015-01-20 ENCOUNTER — Other Ambulatory Visit: Payer: Self-pay | Admitting: Obstetrics and Gynecology

## 2015-01-20 DIAGNOSIS — R928 Other abnormal and inconclusive findings on diagnostic imaging of breast: Secondary | ICD-10-CM

## 2015-01-22 NOTE — Telephone Encounter (Signed)
Dr.Miller, patient did not return call to office to discuss medication. Patient had biopsy of the right breast performed on 01/20/2015. Okay to close encounter?

## 2015-01-22 NOTE — Telephone Encounter (Signed)
OK to close encounter. 

## 2015-01-30 ENCOUNTER — Telehealth: Payer: Self-pay | Admitting: Obstetrics & Gynecology

## 2015-01-30 NOTE — Telephone Encounter (Signed)
Patient returning a call from a closed encounter to discuss medication.

## 2015-01-30 NOTE — Telephone Encounter (Signed)
Left message to call [name] at [phone]. 

## 2015-02-03 NOTE — Telephone Encounter (Signed)
Spoke with patient. Patient had biopsy of right breast on 01/21/2015. Results are available in EPIC. Patient states that she is now scheduled to meet with a surgeon on 6/20/216 for removal of area. Patient states she is very anxious and is having trouble sleeping. Requesting a medication to help reduce her anxiety and sleep. Please see phone note from 01/13/2015 as well. "I just can not focus. I am so scared that I have cancer even though they said it is less that five percent. On top of that my son is leaving for college soon and my mom is sick." Advised patient will speak with Dr.Miller regarding medication request and return call with further recommendations.

## 2015-02-04 MED ORDER — ALPRAZOLAM 0.5 MG PO TABS: ORAL_TABLET | ORAL | Status: DC

## 2015-02-04 NOTE — Telephone Encounter (Signed)
Rx printed and to your desk for signature.

## 2015-02-04 NOTE — Telephone Encounter (Signed)
Ok to send in RX for Xanax 0.5mg  po x 8 hr prn anxiety.  #30/0RF.  She needs to use these really when she needs, not just every 8 hours.  Also, could start by cutting in 1/2 to make sure they won't make her too sleepy.  Can print rx and I will sign it today when I am in the office.

## 2015-02-04 NOTE — Telephone Encounter (Signed)
Spoke with patient. Advised of message as seen below from Dr.Miller. Patient is agreeable. Order printed and faxed to [facility] with cover sheet at [phone].  Routing to provider for final review. Patient agreeable to disposition. Will close encounter.

## 2015-02-09 ENCOUNTER — Ambulatory Visit: Payer: Self-pay | Admitting: Surgery

## 2015-02-09 DIAGNOSIS — N6310 Unspecified lump in the right breast, unspecified quadrant: Secondary | ICD-10-CM

## 2015-02-09 NOTE — H&P (Signed)
Christine Velez 02/09/2015 1:53 PM Location: [facility] Patient #: [id number] DOB: August 10, 1966 Married / Language: [place] / Race: White Female History of Present Illness Maisie Fus A. Anaisa Radi MD; 02/09/2015 4:07 PM) Patient words: sclerosing breast lesion   pt sent at the request of Dr Dalphine Handing for right breast sclerosing lesion picked up mammogram and core biopsy. Has has previous left breast lumpectomy while in college for fibroadenoma. No pain mass or nipple discharge bilaterally. Left lower leg mass noted. Noticed it 1 month ago. Hx of lipoma upper thigh. This has been excised.    Patient: Christine Velez, Christine Velez Collected: 01/20/2015 Client: [facility] Accession: [id number] Received: 01/20/2015 Dalphine Handing, MD DOB: [birth year] Age: 49 Gender: F Reported: 01/21/2015 [address] Patient Ph: [phone] MRN #: [id number] [place], [place] [address] Client Acc#: Chart #: [id number] Phone: [phone] Fax: CC: [place] [facility] [place] CC: [facility] Ardell Isaacs, MD REPORT OF SURGICAL PATHOLOGY FINAL DIAGNOSIS Diagnosis Breast, right, needle core biopsy, LOQ - COMPLEX SCLEROSING LESION WITH CALCIFICATIONS. - PSEUDOANGIOMATOUS STROMAL HYPERPLASIA (PASH). - SEE COMMENT. Microscopic Comment The differential diagnosis of the complex sclerosing lesion includes a radial scar (favored) as well as a hamartomatous        CLINICAL DATA: 48 year old female status post tomosynthesis guided biopsy of right breast architectural distortion  EXAM: DIAGNOSTIC RIGHT MAMMOGRAM POST STEREOTACTIC/TOMOSYNTHESIS GUIDED BIOPSY  COMPARISON: Previous exam(s).  FINDINGS: Mammographic images were obtained following stereotactic/ tomosynthesis guided biopsy of right breast architectural distortion. Post biopsy mammogram demonstrates the X shaped biopsy marker to be along the antero medial aspect of the right breast distortion.  IMPRESSION: Biopsy marker  placement as described above.  Final Assessment: Post Procedure Mammograms for Marker Placement   Electronically Signed By: Dalphine Handing M.D. On: 01/20/2015 13:40.  The patient is a 49 year old female   Other Problems Fay Records, CMA; 02/09/2015 1:53 PM) Back Pain Gastroesophageal Reflux Disease  Past Surgical History Fay Records, CMA; 02/09/2015 1:53 PM) Breast Biopsy Right. Colon Polyp Removal - Colonoscopy Spinal Surgery - Lower Back  Diagnostic Studies History Fay Records, CMA; 02/09/2015 1:53 PM) Colonoscopy within last year Mammogram within last year Pap Smear 1-5 years ago  Allergies Fay Records, CMA; 02/09/2015 1:53 PM) No Known Drug Allergies 02/09/2015  Medication History Fay Records, CMA; 02/09/2015 1:54 PM) Synthroid ( Tablet, Oral) Active. Medications Reconciled  Social History Fay Records, [place]; 02/09/2015 1:53 PM) Alcohol use Occasional alcohol use. Caffeine use Carbonated beverages, Coffee. No drug use Tobacco use Never smoker.  Family History Fay Records, [place]; 02/09/2015 1:53 PM) Cancer Father. Prostate Cancer Father.  Pregnancy / Birth History Fay Records, CMA; 02/09/2015 1:53 PM) Age at menarche 13 years. Contraceptive History Oral contraceptives. Gravida 2 Maternal age 61-30 Para 2 Regular periods     Review of Systems Fay Records CMA; 02/09/2015 1:53 PM) General Not Present- Appetite Loss, Chills, Fatigue, Fever, Night Sweats, Weight Gain and Weight Loss. Skin Present- New Lesions. Not Present- Change in Wart/Mole, Dryness, Hives, Jaundice, Non-Healing Wounds, Rash and Ulcer. HEENT Not Present- Earache, Hearing Loss, Hoarseness, Nose Bleed, Oral Ulcers, Ringing in the Ears, Seasonal Allergies, Sinus Pain, Sore Throat, Visual Disturbances, Wears glasses/contact lenses and Yellow Eyes. Respiratory Not Present- Bloody sputum, Chronic Cough, Difficulty Breathing, Snoring and Wheezing. Breast Present- Breast Pain.  Not Present- Breast Mass, Nipple Discharge and Skin Changes. Cardiovascular Not Present- Chest Pain, Difficulty Breathing Lying Down, Leg Cramps, Palpitations, Rapid Heart Rate, Shortness of Breath and Swelling of Extremities. Gastrointestinal  Not Present- Abdominal Pain, Bloating, Bloody Stool, Change in Bowel Habits, Chronic diarrhea, Constipation, Difficulty Swallowing, Excessive gas, Gets full quickly at meals, Hemorrhoids, Indigestion, Nausea, Rectal Pain and Vomiting. Female Genitourinary Not Present- Frequency, Nocturia, Painful Urination, Pelvic Pain and Urgency. Musculoskeletal Not Present- Back Pain, Joint Pain, Joint Stiffness, Muscle Pain, Muscle Weakness and Swelling of Extremities. Neurological Not Present- Decreased Memory, Fainting, Headaches, Numbness, Seizures, Tingling, Tremor, Trouble walking and Weakness. Psychiatric Not Present- Anxiety, Bipolar, Change in Sleep Pattern, Depression, Fearful and Frequent crying. Endocrine Not Present- Cold Intolerance, Excessive Hunger, Hair Changes, Heat Intolerance, Hot flashes and New Diabetes. Hematology Not Present- Easy Bruising, Excessive bleeding, Gland problems, HIV and Persistent Infections.  Vitals Fay Records CMA; 02/09/2015 1:54 PM) 02/09/2015 1:54 PM Weight: 200 lb Height: 71in Body Surface Area: 2.13 m Body Mass Index: 27.89 kg/m Temp.: 98.70F(Oral)  Pulse: 80 (Regular)  Resp.: 18 (Unlabored)  BP: 122/70 (Sitting, Left Arm, Standard)     Physical Exam (Rhealynn Myhre A. Magdaline Zollars MD; 02/09/2015 4:08 PM)  General Mental Status-Alert. General Appearance-Consistent with stated age. Hydration-Well hydrated. Voice-Normal.  Head and Neck Head-normocephalic, atraumatic with no lesions or palpable masses. Trachea-midline. Thyroid Gland Characteristics - normal size and consistency.  Eye Eyeball - Bilateral-Extraocular movements intact. Sclera/Conjunctiva - Bilateral-No scleral icterus.  Chest and  Lung Exam Chest and lung exam reveals -quiet, even and easy respiratory effort with no use of accessory muscles and on auscultation, normal breath sounds, no adventitious sounds and normal vocal resonance. Inspection Chest Wall - Normal. Back - normal.  Breast Breast - Left-Symmetric, Non Tender, No Biopsy scars, no Dimpling, No Inflammation, No Lumpectomy scars, No Mastectomy scars, No Peau d' Orange. Breast - Right-Symmetric, Non Tender, No Biopsy scars, no Dimpling, No Inflammation, No Lumpectomy scars, No Mastectomy scars, No Peau d' Orange. Breast Lump-No Palpable Breast Mass. Note: left breast scars noted bruising right breast   Cardiovascular Cardiovascular examination reveals -normal heart sounds, regular rate and rhythm with no murmurs and normal pedal pulses bilaterally.  Abdomen Inspection Inspection of the abdomen reveals - No Hernias. Skin - Scar - no surgical scars. Palpation/Percussion Palpation and Percussion of the abdomen reveal - Soft, Non Tender, No Rebound tenderness, No Rigidity (guarding) and No hepatosplenomegaly. Auscultation Auscultation of the abdomen reveals - Bowel sounds normal.  Neurologic Neurologic evaluation reveals -alert and oriented x 3 with no impairment of recent or remote memory. Mental Status-Normal.  Musculoskeletal Normal Exam - Left-Upper Extremity Strength Normal and Lower Extremity Strength Normal. Normal Exam - Right-Upper Extremity Strength Normal and Lower Extremity Strength Normal. Note: fullness left lower leg anterior 3 cm non descript no mobile fatty   Lymphatic Head & Neck - Did not examine. Axillary  General Axillary Region: Bilateral - Description - Normal. Tenderness - Non Tender. Femoral & Inguinal - Did not examine.    Assessment & Plan (Nadie Fiumara A. Gerod Caligiuri MD; 02/09/2015 2:21 PM)  BREAST MASS, RIGHT (611.72  N63) Impression: complex sclerosing lesion discussed observation vs excision she  desires excision Risk of lumpectomy include bleeding, infection, seroma, more surgery, use of seed/wire, wound care, cosmetic deformity and the need for other treatments, death , blood clots, death. Pt agrees to proceed.  Current Plans Pt Education - Breast Diseases: discussed with patient and provided information. Pt Education - [facility] Breast Biopsy [facility]CI LOWER LEG MASS, LEFT (782.2  R22.42) Impression: probable lipoma offered MRI vs observation she will follow for now

## 2015-03-03 ENCOUNTER — Other Ambulatory Visit: Payer: Self-pay | Admitting: Surgery

## 2015-03-03 DIAGNOSIS — N6310 Unspecified lump in the right breast, unspecified quadrant: Secondary | ICD-10-CM

## 2015-04-13 ENCOUNTER — Ambulatory Visit
Admission: RE | Admit: 2015-04-13 | Discharge: 2015-04-13 | Disposition: A | Discharge status: Home / Self Care | Payer: BLUE CROSS/BLUE SHIELD | Source: Ambulatory Visit | Attending: Surgery | Admitting: Surgery

## 2015-04-13 DIAGNOSIS — N6310 Unspecified lump in the right breast, unspecified quadrant: Secondary | ICD-10-CM

## 2015-04-14 ENCOUNTER — Encounter (HOSPITAL_BASED_OUTPATIENT_CLINIC_OR_DEPARTMENT_OTHER)
Admission: RE | Admit: 2015-04-14 | Discharge: 2015-04-14 | Disposition: A | Discharge status: Home / Self Care | Payer: BLUE CROSS/BLUE SHIELD | Source: Ambulatory Visit | Attending: Surgery | Admitting: Surgery

## 2015-04-14 ENCOUNTER — Encounter (HOSPITAL_BASED_OUTPATIENT_CLINIC_OR_DEPARTMENT_OTHER): Payer: Self-pay | Admitting: *Deleted

## 2015-04-14 DIAGNOSIS — K219 Gastro-esophageal reflux disease without esophagitis: Secondary | ICD-10-CM | POA: Diagnosis not present

## 2015-04-14 DIAGNOSIS — E039 Hypothyroidism, unspecified: Secondary | ICD-10-CM | POA: Diagnosis not present

## 2015-04-14 DIAGNOSIS — N6489 Other specified disorders of breast: Secondary | ICD-10-CM | POA: Diagnosis not present

## 2015-04-14 DIAGNOSIS — N6021 Fibroadenosis of right breast: Secondary | ICD-10-CM | POA: Diagnosis not present

## 2015-04-14 DIAGNOSIS — R51 Headache: Secondary | ICD-10-CM | POA: Diagnosis not present

## 2015-04-14 LAB — COMPREHENSIVE METABOLIC PANEL
ALT: 18 U/L (ref 14–54)
AST: 18 U/L (ref 15–41)
Albumin: 3.8 g/dL (ref 3.5–5.0)
Alkaline Phosphatase: 50 U/L (ref 38–126)
Anion gap: 5 (ref 5–15)
BUN: 12 mg/dL (ref 6–20)
CO2: 25 mmol/L (ref 22–32)
Calcium: 8.8 mg/dL — ABNORMAL LOW (ref 8.9–10.3)
Chloride: 108 mmol/L (ref 101–111)
Creatinine, Ser: 0.76 mg/dL (ref 0.44–1.00)
GFR calc Af Amer: >60 mL/min (ref >60)
GFR calc non Af Amer: >60 mL/min (ref >60)
Glucose, Bld: 106 mg/dL — ABNORMAL HIGH (ref 65–99)
Potassium: 4.6 mmol/L (ref 3.5–5.1)
Sodium: 138 mmol/L (ref 135–145)
Total Bilirubin: 0.2 mg/dL — ABNORMAL LOW (ref 0.3–1.2)
Total Protein: 6.3 g/dL — ABNORMAL LOW (ref 6.5–8.1)

## 2015-04-14 LAB — CBC WITH DIFFERENTIAL/PLATELET
Basophils Absolute: 0 10*3/uL (ref 0.0–0.1)
Basophils Relative: 0 % (ref 0–1)
Eosinophils Absolute: 0.4 10*3/uL (ref 0.0–0.7)
Eosinophils Relative: 6 % — ABNORMAL HIGH (ref 0–5)
HCT: 41 % (ref 36.0–46.0)
Hemoglobin: 13.7 g/dL (ref 12.0–15.0)
Lymphocytes Relative: 21 % (ref 12–46)
Lymphs Abs: 1.6 10*3/uL (ref 0.7–4.0)
MCH: 30.3 pg (ref 26.0–34.0)
MCHC: 33.4 g/dL (ref 30.0–36.0)
MCV: 90.7 fL (ref 78.0–100.0)
Monocytes Absolute: 0.7 10*3/uL (ref 0.1–1.0)
Monocytes Relative: 10 % (ref 3–12)
Neutro Abs: 4.6 10*3/uL (ref 1.7–7.7)
Neutrophils Relative %: 63 % (ref 43–77)
Platelets: 212 10*3/uL (ref 150–400)
RBC: 4.52 MIL/uL (ref 3.87–5.11)
RDW: 12.9 % (ref 11.5–15.5)
WBC: 7.3 10*3/uL (ref 4.0–10.5)

## 2015-04-16 ENCOUNTER — Encounter (HOSPITAL_BASED_OUTPATIENT_CLINIC_OR_DEPARTMENT_OTHER)
Admission: RE | Disposition: A | Discharge status: Home / Self Care | Payer: Self-pay | Source: Ambulatory Visit | Attending: Surgery

## 2015-04-16 ENCOUNTER — Ambulatory Visit
Admission: RE | Admit: 2015-04-16 | Discharge: 2015-04-16 | Disposition: A | Discharge status: Home / Self Care | Payer: BLUE CROSS/BLUE SHIELD | Source: Ambulatory Visit | Attending: Surgery | Admitting: Surgery

## 2015-04-16 ENCOUNTER — Encounter (HOSPITAL_BASED_OUTPATIENT_CLINIC_OR_DEPARTMENT_OTHER): Payer: Self-pay | Admitting: *Deleted

## 2015-04-16 ENCOUNTER — Ambulatory Visit (HOSPITAL_BASED_OUTPATIENT_CLINIC_OR_DEPARTMENT_OTHER): Payer: BLUE CROSS/BLUE SHIELD | Admitting: Anesthesiology

## 2015-04-16 ENCOUNTER — Ambulatory Visit (HOSPITAL_BASED_OUTPATIENT_CLINIC_OR_DEPARTMENT_OTHER)
Admission: RE | Admit: 2015-04-16 | Discharge: 2015-04-16 | Disposition: A | Discharge status: Home / Self Care | Payer: BLUE CROSS/BLUE SHIELD | Source: Ambulatory Visit | Attending: Surgery | Admitting: Surgery

## 2015-04-16 DIAGNOSIS — E039 Hypothyroidism, unspecified: Secondary | ICD-10-CM | POA: Insufficient documentation

## 2015-04-16 DIAGNOSIS — N6021 Fibroadenosis of right breast: Secondary | ICD-10-CM | POA: Insufficient documentation

## 2015-04-16 DIAGNOSIS — K219 Gastro-esophageal reflux disease without esophagitis: Secondary | ICD-10-CM | POA: Insufficient documentation

## 2015-04-16 DIAGNOSIS — N6489 Other specified disorders of breast: Secondary | ICD-10-CM | POA: Diagnosis not present

## 2015-04-16 DIAGNOSIS — N6310 Unspecified lump in the right breast, unspecified quadrant: Secondary | ICD-10-CM

## 2015-04-16 DIAGNOSIS — R51 Headache: Secondary | ICD-10-CM | POA: Insufficient documentation

## 2015-04-16 HISTORY — DX: Hypothyroidism, unspecified: E03.9

## 2015-04-16 HISTORY — DX: Gastro-esophageal reflux disease without esophagitis: K21.9

## 2015-04-16 HISTORY — PX: BREAST LUMPECTOMY WITH RADIOACTIVE SEED LOCALIZATION: SHX6424

## 2015-04-16 HISTORY — DX: Unspecified lump in the right breast, unspecified quadrant: N63.10

## 2015-04-16 LAB — POCT HEMOGLOBIN-HEMACUE: Hemoglobin: 13.1 g/dL (ref 12.0–15.0)

## 2015-04-16 SURGERY — BREAST LUMPECTOMY WITH RADIOACTIVE SEED LOCALIZATION
Anesthesia: General | Site: Breast | Laterality: Right

## 2015-04-16 MED ORDER — SCOPOLAMINE 1 MG/3DAYS TD PT72: 1.0000 | MEDICATED_PATCH | TRANSDERMAL | Status: DC

## 2015-04-16 MED ORDER — OXYCODONE-ACETAMINOPHEN 5-325 MG PO TABS
ORAL_TABLET | ORAL | Status: AC
  Filled 2015-04-16: qty 1

## 2015-04-16 MED ORDER — HYDROMORPHONE HCL 1 MG/ML IJ SOLN
INTRAMUSCULAR | Status: AC
  Filled 2015-04-16: qty 1

## 2015-04-16 MED ORDER — FENTANYL CITRATE (PF) 100 MCG/2ML IJ SOLN
INTRAMUSCULAR | Status: AC
  Filled 2015-04-16: qty 2

## 2015-04-16 MED ORDER — DEXTROSE 5 % IV SOLN
3.0000 g | INTRAVENOUS | Status: AC
  Administered 2015-04-16: 2 g via INTRAVENOUS

## 2015-04-16 MED ORDER — LACTATED RINGERS IV SOLN
INTRAVENOUS | Status: DC
  Administered 2015-04-16 (×2): via INTRAVENOUS

## 2015-04-16 MED ORDER — SCOPOLAMINE 1 MG/3DAYS TD PT72
MEDICATED_PATCH | TRANSDERMAL | Status: AC
  Filled 2015-04-16: qty 1

## 2015-04-16 MED ORDER — OXYCODONE-ACETAMINOPHEN 5-325 MG PO TABS: 1.0000 | ORAL_TABLET | ORAL | Status: DC | PRN

## 2015-04-16 MED ORDER — FENTANYL CITRATE (PF) 100 MCG/2ML IJ SOLN
100.0000 ug | Freq: Once | INTRAMUSCULAR | Status: AC
  Administered 2015-04-16: 50 ug via INTRAVENOUS

## 2015-04-16 MED ORDER — FENTANYL CITRATE (PF) 100 MCG/2ML IJ SOLN
50.0000 ug | INTRAMUSCULAR | Status: DC | PRN
  Administered 2015-04-16: 100 ug via INTRAVENOUS

## 2015-04-16 MED ORDER — GLYCOPYRROLATE 0.2 MG/ML IJ SOLN: 0.2000 mg | Freq: Once | INTRAMUSCULAR | Status: DC | PRN

## 2015-04-16 MED ORDER — OXYCODONE-ACETAMINOPHEN 5-325 MG PO TABS
1.0000 | ORAL_TABLET | ORAL | Status: DC | PRN
  Administered 2015-04-16: 1 via ORAL

## 2015-04-16 MED ORDER — DEXAMETHASONE SODIUM PHOSPHATE 4 MG/ML IJ SOLN
INTRAMUSCULAR | Status: DC | PRN
  Administered 2015-04-16: 10 mg via INTRAVENOUS

## 2015-04-16 MED ORDER — BUPIVACAINE-EPINEPHRINE (PF) 0.25% -1:200000 IJ SOLN
INTRAMUSCULAR | Status: DC | PRN
Start: 2015-04-16 — End: 2015-04-16
  Administered 2015-04-16: 10 mL

## 2015-04-16 MED ORDER — MIDAZOLAM HCL 2 MG/2ML IJ SOLN
1.0000 mg | INTRAMUSCULAR | Status: DC | PRN
  Administered 2015-04-16: 2 mg via INTRAVENOUS

## 2015-04-16 MED ORDER — MIDAZOLAM HCL 2 MG/2ML IJ SOLN
INTRAMUSCULAR | Status: AC
  Filled 2015-04-16: qty 2

## 2015-04-16 MED ORDER — PROPOFOL 10 MG/ML IV BOLUS
INTRAVENOUS | Status: DC | PRN
  Administered 2015-04-16: 200 mg via INTRAVENOUS

## 2015-04-16 MED ORDER — PROMETHAZINE HCL 25 MG/ML IJ SOLN: 6.2500 mg | INTRAMUSCULAR | Status: DC | PRN

## 2015-04-16 MED ORDER — HYDROMORPHONE HCL 1 MG/ML IJ SOLN
0.2500 mg | INTRAMUSCULAR | Status: DC | PRN
  Administered 2015-04-16 (×2): 0.5 mg via INTRAVENOUS

## 2015-04-16 MED ORDER — FENTANYL CITRATE (PF) 100 MCG/2ML IJ SOLN
INTRAMUSCULAR | Status: AC
  Filled 2015-04-16: qty 4

## 2015-04-16 MED ORDER — SCOPOLAMINE 1 MG/3DAYS TD PT72
1.0000 | MEDICATED_PATCH | Freq: Once | TRANSDERMAL | Status: DC | PRN
  Administered 2015-04-16: 1.5 mg via TRANSDERMAL

## 2015-04-16 MED ORDER — LIDOCAINE HCL (CARDIAC) 20 MG/ML IV SOLN
INTRAVENOUS | Status: DC | PRN
  Administered 2015-04-16: 50 mg via INTRAVENOUS

## 2015-04-16 MED ORDER — CEFAZOLIN SODIUM-DEXTROSE 2-3 GM-% IV SOLR
INTRAVENOUS | Status: AC
  Filled 2015-04-16: qty 50

## 2015-04-16 MED ORDER — BUPIVACAINE-EPINEPHRINE (PF) 0.25% -1:200000 IJ SOLN
INTRAMUSCULAR | Status: AC
  Filled 2015-04-16: qty 150

## 2015-04-16 SURGICAL SUPPLY — 41 items
BINDER BREAST LRG (GAUZE/BANDAGES/DRESSINGS) ×1 IMPLANT
BLADE SURG 15 STRL LF DISP TIS (BLADE) ×1 IMPLANT
BLADE SURG 15 STRL SS (BLADE) ×2
CANISTER SUC SOCK COL 7IN (MISCELLANEOUS) ×2 IMPLANT
CHLORAPREP W/TINT 26ML (MISCELLANEOUS) ×2 IMPLANT
CLIP TI WIDE RED SMALL 6 (CLIP) ×2 IMPLANT
COVER BACK TABLE 60X90IN (DRAPES) ×2 IMPLANT
COVER MAYO STAND STRL (DRAPES) ×2 IMPLANT
COVER PROBE W GEL 5X96 (DRAPES) ×2 IMPLANT
DECANTER SPIKE VIAL GLASS SM (MISCELLANEOUS) IMPLANT
DEVICE DUBIN W/COMP PLATE 8390 (MISCELLANEOUS) ×2 IMPLANT
DRAPE LAPAROTOMY 100X72 PEDS (DRAPES) ×2 IMPLANT
DRAPE UTILITY XL STRL (DRAPES) ×2 IMPLANT
ELECT COATED BLADE 2.86 ST (ELECTRODE) ×2 IMPLANT
ELECT REM PT RETURN 9FT ADLT (ELECTROSURGICAL) ×2
ELECTRODE REM PT RTRN 9FT ADLT (ELECTROSURGICAL) ×1 IMPLANT
GLOVE BIOGEL PI IND STRL 6.5 (GLOVE) IMPLANT
GLOVE BIOGEL PI IND STRL 8 (GLOVE) ×1 IMPLANT
GLOVE BIOGEL PI INDICATOR 6.5 (GLOVE) ×2
GLOVE BIOGEL PI INDICATOR 8 (GLOVE) ×1
GLOVE ECLIPSE 6.5 STRL STRAW (GLOVE) ×1 IMPLANT
GLOVE ECLIPSE 8.0 STRL XLNG CF (GLOVE) ×2 IMPLANT
GOWN STRL REUS W/ TWL LRG LVL3 (GOWN DISPOSABLE) ×2 IMPLANT
GOWN STRL REUS W/TWL LRG LVL3 (GOWN DISPOSABLE) ×4
HEMOSTAT SNOW SURGICEL 2X4 (HEMOSTASIS) IMPLANT
KIT MARKER MARGIN INK (KITS) ×2 IMPLANT
LIQUID BAND (GAUZE/BANDAGES/DRESSINGS) ×3 IMPLANT
NDL HYPO 25X1 1.5 SAFETY (NEEDLE) ×1 IMPLANT
NEEDLE HYPO 25X1 1.5 SAFETY (NEEDLE) ×2 IMPLANT
NS IRRIG 1000ML POUR BTL (IV SOLUTION) ×2 IMPLANT
PACK BASIN DAY SURGERY FS (CUSTOM PROCEDURE TRAY) ×2 IMPLANT
PENCIL BUTTON HOLSTER BLD 10FT (ELECTRODE) ×2 IMPLANT
SLEEVE SCD COMPRESS KNEE MED (MISCELLANEOUS) ×2 IMPLANT
SPONGE LAP 4X18 X RAY DECT (DISPOSABLE) ×2 IMPLANT
SUT MNCRL AB 4-0 PS2 18 (SUTURE) ×2 IMPLANT
SUT SILK 2 0 SH (SUTURE) IMPLANT
SUT VICRYL 3-0 CR8 SH (SUTURE) ×2 IMPLANT
SYR CONTROL 10ML LL (SYRINGE) ×2 IMPLANT
TOWEL OR 17X24 6PK STRL BLUE (TOWEL DISPOSABLE) ×2 IMPLANT
TOWEL OR NON WOVEN STRL DISP B (DISPOSABLE) ×2 IMPLANT
TUBE CONNECTING 20X1/4 (TUBING) IMPLANT

## 2015-04-16 NOTE — Op Note (Signed)
Preoperative diagnosis:Right breast sclerosing lesion   Postoperative diagnosis: Same   Procedure: Right breast seed localized lumpectomy  Surgeon: Harriette Bouillon M.D.  Anesthesia: Gen. With 0.25% Sensorcaine local wit epinephrine   EBL: 20 cc  Specimen: Right  breast tissue with clip and radioactive seed in the specimen. Verified with neoprobe and radiographic image showing both seed and clip in specimen  Indications for procedure: The patient presents for right breast lumpectomy after core biopsy showed sclerosing lesion . Discussed the rationale for considering excision. Small risk of malignancy associated with  lesion after core biopsy. Discussed observation. Discussed wire localization. Patient desired excision.The procedure has been discussed with the patient. Alternatives to surgery have been discussed with the patient.  Risks of surgery include bleeding,  Infection,  Seroma formation, death,  Cosmetic deformity,  Injury to near by structures  and the need for further surgery.   The patient understands and wishes to proceed.   Description of procedure: Patient underwent seed placement as an outpatient. Patient presents today for right  breast seed localized lumpectomy. Patient and holding area. Questions are answered and neoprobe used to verify seed location. Patient taken back to the operating room and placed upon the OR table. After induction of general anesthesia, right  breast prepped and draped in a sterile fashion. Timeout was done to verify proper  procedure. Neoprobe used and hot spot identified and right  breast central lower regiont. This was marked with pen. Curvilinear incision made along the inferior border of the NAC. Dissection used with the help of a neoprobe around the tissue where the seed and clip were located. Tissue removed in its entirety with gross  Negative margins.. Neoprobe used and seed within specimen. Radiographs taken which show clip and seed  In  specimen.Hemostasis achieved and cavity closed with 3-0 Vicryl and 4-0 Monocryl. Dermabond applied. All final counts found to be correct. Specimen transported to pathology. Patient awoke extubated taken to recovery in satisfactory condition.

## 2015-04-16 NOTE — Anesthesia Postprocedure Evaluation (Signed)
Anesthesia Post Note  Patient: Christine Velez [facility]  Procedure(s) Performed: Procedure(s) (LRB): RIGHT BREAST LUMPECTOMY WITH RADIOACTIVE SEED LOCALIZATION (Right)  Anesthesia type: general  Patient location: PACU  Post pain: Pain level controlled  Post assessment: Patient's Cardiovascular Status Stable  Last Vitals:  Filed Vitals:   04/16/15 0915  BP: 108/58  Pulse: 72  Temp:   Resp: 15    Post vital signs: Reviewed and stable  Level of consciousness: sedated  Complications: No apparent anesthesia complications

## 2015-04-16 NOTE — Interval H&P Note (Signed)
History and Physical Interval Note:  04/16/2015 7:21 AM  Christine Velez  has presented today for surgery, with the diagnosis of Right Breast Mass  The various methods of treatment have been discussed with the patient and family. After consideration of risks, benefits and other options for treatment, the patient has consented to  Procedure(s): RIGHT BREAST LUMPECTOMY WITH RADIOACTIVE SEED LOCALIZATION (Right) as a surgical intervention .  The patient's history has been reviewed, patient examined, no change in status, stable for surgery.  I have reviewed the patient's chart and labs.  Questions were answered to the patient's satisfaction.     Jhaden Pizzuto A.

## 2015-04-16 NOTE — Anesthesia Procedure Notes (Signed)
Procedure Name: LMA Insertion Date/Time: 04/16/2015 7:31 AM Performed by: Caren Macadam Pre-anesthesia Checklist: Patient identified, Emergency Drugs available, Suction available and Patient being monitored Patient Re-evaluated:Patient Re-evaluated prior to inductionOxygen Delivery Method: Circle System Utilized Preoxygenation: Pre-oxygenation with 100% oxygen Intubation Type: IV induction Ventilation: Mask ventilation without difficulty LMA: LMA inserted LMA Size: 4.0 Number of attempts: 1 Airway Equipment and Method: Bite block Placement Confirmation: positive ETCO2 and breath sounds checked- equal and bilateral Tube secured with: Tape Dental Injury: Teeth and Oropharynx as per pre-operative assessment

## 2015-04-16 NOTE — H&P (Signed)
H&P   Christine Velez (MR# [id number])      H&P Info    Author Note Status Last Update User Last Update Date/Time   Harriette Bouillon, MD Signed Harriette Bouillon, MD 02/09/2015 4:09 PM    H&P    Expand All Collapse All   Christine Velez 02/09/2015 1:53 PM Location: [facility] Patient #: [id number] DOB: May 06, 1966 Married / Language: [place] / Race: White Female History of Present Illness Maisie Fus A. Sharyl Panchal MD; 02/09/2015 4:07 PM) Patient words: sclerosing breast lesion   pt sent at the request of Dr Dalphine Handing for right breast sclerosing lesion picked up mammogram and core biopsy. Has has previous left breast lumpectomy while in college for fibroadenoma. No pain mass or nipple discharge bilaterally. Left lower leg mass noted. Noticed it 1 month ago. Hx of lipoma upper thigh. This has been excised.    Patient: Christine Velez, Christine Velez Collected: 01/20/2015 Client: [facility] Accession: [id number] Received: 01/20/2015 Dalphine Handing, MD DOB: [birth year] Age: 20 Gender: F Reported: 01/21/2015 [address] Patient Ph: [phone] MRN #: [id number] [place], [place] [address] Client Acc#: Chart #: [id number] Phone: [phone] Fax: CC: [place] CC: [place] E Ardell Isaacs, MD REPORT OF SURGICAL PATHOLOGY FINAL DIAGNOSIS Diagnosis Breast, right, needle core biopsy, LOQ - COMPLEX SCLEROSING LESION WITH CALCIFICATIONS. - PSEUDOANGIOMATOUS STROMAL HYPERPLASIA (PASH). - SEE COMMENT. Microscopic Comment The differential diagnosis of the complex sclerosing lesion includes a radial scar (favored) as well as a hamartomatous        CLINICAL DATA: 49 year old female status post tomosynthesis guided biopsy of right breast architectural distortion  EXAM: DIAGNOSTIC RIGHT MAMMOGRAM POST STEREOTACTIC/TOMOSYNTHESIS GUIDED BIOPSY  COMPARISON: Previous exam(s).  FINDINGS: Mammographic images were obtained following  stereotactic/ tomosynthesis guided biopsy of right breast architectural distortion. Post biopsy mammogram demonstrates the X shaped biopsy marker to be along the antero medial aspect of the right breast distortion.  IMPRESSION: Biopsy marker placement as described above.  Final Assessment: Post Procedure Mammograms for Marker Placement   Electronically Signed By: Dalphine Handing M.D. On: 01/20/2015 13:40.  The patient is a 49 year old female   Other Problems Fay Records, CMA; 02/09/2015 1:53 PM) Back Pain Gastroesophageal Reflux Disease  Past Surgical History Fay Records, CMA; 02/09/2015 1:53 PM) Breast Biopsy Right. Colon Polyp Removal - Colonoscopy Spinal Surgery - Lower Back  Diagnostic Studies History Fay Records, CMA; 02/09/2015 1:53 PM) Colonoscopy within last year Mammogram within last year Pap Smear 1-5 years ago  Allergies Fay Records, CMA; 02/09/2015 1:53 PM) No Known Drug Allergies 02/09/2015  Medication History Fay Records, CMA; 02/09/2015 1:54 PM) Synthroid ( Tablet, Oral) Active. Medications Reconciled  Social History Fay Records, [place]; 02/09/2015 1:53 PM) Alcohol use Occasional alcohol use. Caffeine use Carbonated beverages, Coffee. No drug use Tobacco use Never smoker.  Family History Fay Records, [place]; 02/09/2015 1:53 PM) Cancer Father. Prostate Cancer Father.  Pregnancy / Birth History Fay Records, CMA; 02/09/2015 1:53 PM) Age at menarche 13 years. Contraceptive History Oral contraceptives. Gravida 2 Maternal age 68-30 Para 2 Regular periods     Review of Systems Fay Records CMA; 02/09/2015 1:53 PM) General Not Present- Appetite Loss, Chills, Fatigue, Fever, Night Sweats, Weight Gain and Weight Loss. Skin Present- New Lesions. Not Present- Change in Wart/Mole, Dryness, Hives, Jaundice, Non-Healing Wounds, Rash and Ulcer. HEENT Not Present- Earache, Hearing Loss, Hoarseness, Nose Bleed, Oral Ulcers, Ringing in the  Ears, Seasonal Allergies, Sinus Pain, Sore Throat, Visual Disturbances, Wears glasses/contact  lenses and Yellow Eyes. Respiratory Not Present- Bloody sputum, Chronic Cough, Difficulty Breathing, Snoring and Wheezing. Breast Present- Breast Pain. Not Present- Breast Mass, Nipple Discharge and Skin Changes. Cardiovascular Not Present- Chest Pain, Difficulty Breathing Lying Down, Leg Cramps, Palpitations, Rapid Heart Rate, Shortness of Breath and Swelling of Extremities. Gastrointestinal Not Present- Abdominal Pain, Bloating, Bloody Stool, Change in Bowel Habits, Chronic diarrhea, Constipation, Difficulty Swallowing, Excessive gas, Gets full quickly at meals, Hemorrhoids, Indigestion, Nausea, Rectal Pain and Vomiting. Female Genitourinary Not Present- Frequency, Nocturia, Painful Urination, Pelvic Pain and Urgency. Musculoskeletal Not Present- Back Pain, Joint Pain, Joint Stiffness, Muscle Pain, Muscle Weakness and Swelling of Extremities. Neurological Not Present- Decreased Memory, Fainting, Headaches, Numbness, Seizures, Tingling, Tremor, Trouble walking and Weakness. Psychiatric Not Present- Anxiety, Bipolar, Change in Sleep Pattern, Depression, Fearful and Frequent crying. Endocrine Not Present- Cold Intolerance, Excessive Hunger, Hair Changes, Heat Intolerance, Hot flashes and New Diabetes. Hematology Not Present- Easy Bruising, Excessive bleeding, Gland problems, HIV and Persistent Infections.  Vitals Fay Records CMA; 02/09/2015 1:54 PM) 02/09/2015 1:54 PM Weight: 200 lb Height: 71in Body Surface Area: 2.13 m Body Mass Index: 27.89 kg/m Temp.: 98.62F(Oral)  Pulse: 80 (Regular)  Resp.: 18 (Unlabored)  BP: 122/70 (Sitting, Left Arm, Standard)     Physical Exam (Caterine Mcmeans A. Kariya Lavergne MD; 02/09/2015 4:08 PM)  General Mental Status-Alert. General Appearance-Consistent with stated age. Hydration-Well hydrated. Voice-Normal.  Head and Neck Head-normocephalic,  atraumatic with no lesions or palpable masses. Trachea-midline. Thyroid Gland Characteristics - normal size and consistency.  Eye Eyeball - Bilateral-Extraocular movements intact. Sclera/Conjunctiva - Bilateral-No scleral icterus.  Chest and Lung Exam Chest and lung exam reveals -quiet, even and easy respiratory effort with no use of accessory muscles and on auscultation, normal breath sounds, no adventitious sounds and normal vocal resonance. Inspection Chest Wall - Normal. Back - normal.  Breast Breast - Left-Symmetric, Non Tender, No Biopsy scars, no Dimpling, No Inflammation, No Lumpectomy scars, No Mastectomy scars, No Peau d' Orange. Breast - Right-Symmetric, Non Tender, No Biopsy scars, no Dimpling, No Inflammation, No Lumpectomy scars, No Mastectomy scars, No Peau d' Orange. Breast Lump-No Palpable Breast Mass. Note: left breast scars noted bruising right breast   Cardiovascular Cardiovascular examination reveals -normal heart sounds, regular rate and rhythm with no murmurs and normal pedal pulses bilaterally.  Abdomen Inspection Inspection of the abdomen reveals - No Hernias. Skin - Scar - no surgical scars. Palpation/Percussion Palpation and Percussion of the abdomen reveal - Soft, Non Tender, No Rebound tenderness, No Rigidity (guarding) and No hepatosplenomegaly. Auscultation Auscultation of the abdomen reveals - Bowel sounds normal.  Neurologic Neurologic evaluation reveals -alert and oriented x 3 with no impairment of recent or remote memory. Mental Status-Normal.  Musculoskeletal Normal Exam - Left-Upper Extremity Strength Normal and Lower Extremity Strength Normal. Normal Exam - Right-Upper Extremity Strength Normal and Lower Extremity Strength Normal. Note: fullness left lower leg anterior 3 cm non descript no mobile fatty   Lymphatic Head & Neck - Did not examine. Axillary  General Axillary Region: Bilateral - Description -  Normal. Tenderness - Non Tender. Femoral & Inguinal - Did not examine.    Assessment & Plan (Orion Mole A. Austyn Seier MD; 02/09/2015 2:21 PM)  BREAST MASS, RIGHT (611.72  N63) Impression: complex sclerosing lesion discussed observation vs excision she desires excision Risk of lumpectomy include bleeding, infection, seroma, more surgery, use of seed/wire, wound care, cosmetic deformity and the need for other treatments, death , blood clots, death. Pt agrees to proceed.  Current Plans Pt  Education - Breast Diseases: discussed with patient and provided information. Pt Education - [facility] Breast Biopsy HCI LOWER LEG MASS, LEFT (782.2  R22.42) Impression: probable lipoma offered MRI vs observation she will follow for now

## 2015-04-16 NOTE — Transfer of Care (Signed)
Immediate Anesthesia Transfer of Care Note  Patient: Christine Velez [facility]  Procedure(s) Performed: Procedure(s): RIGHT BREAST LUMPECTOMY WITH RADIOACTIVE SEED LOCALIZATION (Right)  Patient Location: PACU  Anesthesia Type:General  Level of Consciousness: awake  Airway & Oxygen Therapy: Patient Spontanous Breathing and Patient connected to face mask oxygen  Post-op Assessment: Report given to RN and Post -op Vital signs reviewed and stable  Post vital signs: Reviewed and stable  Last Vitals:  Filed Vitals:   04/16/15 0630  BP: 111/67  Pulse: 66  Temp: 36.5 C  Resp: 20    Complications: No apparent anesthesia complications

## 2015-04-16 NOTE — Anesthesia Preprocedure Evaluation (Addendum)
Anesthesia Evaluation  Patient identified by MRN, date of birth, ID band Patient awake    Reviewed: Allergy & Precautions, NPO status , Patient's Chart, lab work & pertinent test results  History of Anesthesia Complications (+) PONV  Airway Mallampati: II  TM Distance: >3 FB Neck ROM: Full    Dental  (+) Teeth Intact, Dental Advisory Given   Pulmonary    Pulmonary exam normal       Cardiovascular negative cardio ROS Normal cardiovascular exam    Neuro/Psych  Headaches, negative psych ROS   GI/Hepatic Neg liver ROS, GERD-  Controlled,  Endo/Other  Hypothyroidism   Renal/GU negative Renal ROS     Musculoskeletal   Abdominal   Peds  Hematology   Anesthesia Other Findings   Reproductive/Obstetrics                            Anesthesia Physical Anesthesia Plan  ASA: II  Anesthesia Plan: General   Post-op Pain Management:    Induction: Intravenous  Airway Management Planned: LMA  Additional Equipment:   Intra-op Plan:   Post-operative Plan: Extubation in OR  Informed Consent: I have reviewed the patients History and Physical, chart, labs and discussed the procedure including the risks, benefits and alternatives for the proposed anesthesia with the patient or authorized representative who has indicated his/her understanding and acceptance.   Dental advisory given  Plan Discussed with: CRNA, Anesthesiologist and Surgeon  Anesthesia Plan Comments:        Anesthesia Quick Evaluation

## 2015-04-16 NOTE — Discharge Instructions (Signed)
[facility] °Office Phone Number [phone] ° °BREAST BIOPSY/ PARTIAL MASTECTOMY: POST OP INSTRUCTIONS ° °Always review your discharge instruction sheet given to you by the facility where your surgery was performed. ° °IF YOU HAVE DISABILITY OR FAMILY LEAVE FORMS, YOU MUST BRING THEM TO THE OFFICE FOR PROCESSING.  DO NOT GIVE THEM TO YOUR DOCTOR. ° °1. A prescription for pain medication may be given to you upon discharge.  Take your pain medication as prescribed, if needed.  If narcotic pain medicine is not needed, then you may take acetaminophen (Tylenol) or ibuprofen (Advil) as needed. °2. Take your usually prescribed medications unless otherwise directed °3. If you need a refill on your pain medication, please contact your pharmacy.  They will contact our office to request authorization.  Prescriptions will not be filled after 5pm or on week-ends. °4. You should eat very light the first 24 hours after surgery, such as soup, crackers, pudding, etc.  Resume your normal diet the day after surgery. °5. Most patients will experience some swelling and bruising in the breast.  Ice packs and a good support bra will help.  Swelling and bruising can take several days to resolve.  °6. It is common to experience some constipation if taking pain medication after surgery.  Increasing fluid intake and taking a stool softener will usually help or prevent this problem from occurring.  A mild laxative (Milk of Magnesia or Miralax) should be taken according to package directions if there are no bowel movements after 48 hours. °7. Unless discharge instructions indicate otherwise, you may remove your bandages 24-48 hours after surgery, and you may shower at that time.  You may have steri-strips (small skin tapes) in place directly over the incision.  These strips should be left on the skin for 7-10 days.  If your surgeon used skin glue on the incision, you may shower in 24 hours.  The glue will flake off over the  next 2-3 weeks.  Any sutures or staples will be removed at the office during your follow-up visit. °8. ACTIVITIES:  You may resume regular daily activities (gradually increasing) beginning the next day.  Wearing a good support bra or sports bra minimizes pain and swelling.  You may have sexual intercourse when it is comfortable. °a. You may drive when you no longer are taking prescription pain medication, you can comfortably wear a seatbelt, and you can safely maneuver your car and apply brakes. °b. RETURN TO WORK:  ______________________________________________________________________________________ °9. You should see your doctor in the office for a follow-up appointment approximately two weeks after your surgery.  Your doctor’s nurse will typically make your follow-up appointment when she calls you with your pathology report.  Expect your pathology report 2-3 business days after your surgery.  You may call to check if you do not hear from us after three days. °10. OTHER INSTRUCTIONS: _______________________________________________________________________________________________ _____________________________________________________________________________________________________________________________________ °_____________________________________________________________________________________________________________________________________ °_____________________________________________________________________________________________________________________________________ ° °WHEN TO CALL YOUR DOCTOR: °1. Fever over 101.0 °2. Nausea and/or vomiting. °3. Extreme swelling or bruising. °4. Continued bleeding from incision. °5. Increased pain, redness, or drainage from the incision. ° °The clinic staff is available to answer your questions during regular business hours.  Please don’t hesitate to call and ask to speak to one of the nurses for clinical concerns.  If you have a medical emergency, go to the nearest  emergency room or call 911.  A surgeon from [facility] is always on call at the hospital. ° °For further questions, please visit [email or web address]  ° ° ° °  Post Anesthesia Home Care Instructions ° °Activity: °Get plenty of rest for the remainder of the day. A responsible adult should stay with you for 24 hours following the procedure.  °For the next 24 hours, DO NOT: °-Drive a car °-Operate machinery °-Drink alcoholic beverages °-Take any medication unless instructed by your physician °-Make any legal decisions or sign important papers. ° °Meals: °Start with liquid foods such as gelatin or soup. Progress to regular foods as tolerated. Avoid greasy, spicy, heavy foods. If nausea and/or vomiting occur, drink only clear liquids until the nausea and/or vomiting subsides. Call your physician if vomiting continues. ° °Special Instructions/Symptoms: °Your throat may feel dry or sore from the anesthesia or the breathing tube placed in your throat during surgery. If this causes discomfort, gargle with warm salt water. The discomfort should disappear within 24 hours. ° °If you had a scopolamine patch placed behind your ear for the management of post- operative nausea and/or vomiting: ° °1. The medication in the patch is effective for 72 hours, after which it should be removed.  Wrap patch in a tissue and discard in the trash. Wash hands thoroughly with soap and water. °2. You may remove the patch earlier than 72 hours if you experience unpleasant side effects which may include dry mouth, dizziness or visual disturbances. °3. Avoid touching the patch. Wash your hands with soap and water after contact with the patch. °  ° °

## 2015-04-17 ENCOUNTER — Encounter (HOSPITAL_BASED_OUTPATIENT_CLINIC_OR_DEPARTMENT_OTHER): Payer: Self-pay | Admitting: Surgery

## 2015-04-29 ENCOUNTER — Other Ambulatory Visit: Payer: Self-pay | Admitting: Surgery

## 2015-04-29 DIAGNOSIS — D0501 Lobular carcinoma in situ of right breast: Secondary | ICD-10-CM

## 2015-05-10 ENCOUNTER — Encounter: Payer: Self-pay | Admitting: Obstetrics and Gynecology

## 2015-05-12 ENCOUNTER — Telehealth: Payer: Self-pay | Admitting: Emergency Medicine

## 2015-05-12 NOTE — Telephone Encounter (Signed)
Out of hold per Dr. Silva   

## 2015-05-12 NOTE — Telephone Encounter (Signed)
-----   Message from Patton Salles, MD sent at 05/10/2015 11:46 AM EDT ----- Regarding: RE: mammogram hold  Ok to remove from any mammogram hold.  Under care of the surgeon.   Thanks. ----- Message -----    From: Joeseph Amor, RN    Sent: 05/08/2015   2:28 PM      To: [facility] Rosalin Hawking, MD Subject: mammogram hold                                 Dr. Edward Jolly,  Patient completed surgery and path results in EPIC.  Okay to remove from hold?

## 2015-05-28 ENCOUNTER — Telehealth: Payer: Self-pay | Admitting: *Deleted

## 2015-05-28 NOTE — Telephone Encounter (Signed)
Received referral from [facility].  Called pt and confirmed 06/02/15 high risk appt w/ pt.  Unable to mail packet - gave verbal, directions, instructions and placed a note for an intake form to be given to the pt at time of check in.  Emailed Shavon at [facility] to make her aware.  Emailed [place] for tracking.  Placed a copy of the records in Dr. Latanya Maudlin box and took one to HIM to scan.

## 2015-06-02 ENCOUNTER — Encounter: Payer: Self-pay | Admitting: Hematology

## 2015-06-02 ENCOUNTER — Ambulatory Visit (HOSPITAL_BASED_OUTPATIENT_CLINIC_OR_DEPARTMENT_OTHER): Payer: BLUE CROSS/BLUE SHIELD | Admitting: Hematology

## 2015-06-02 ENCOUNTER — Telehealth: Payer: Self-pay | Admitting: Hematology

## 2015-06-02 VITALS — BP 109/57 | HR 91 | Temp 98.4°F | Resp 18 | Ht 71.0 in | Wt 202.2 lb

## 2015-06-02 DIAGNOSIS — N62 Hypertrophy of breast: Secondary | ICD-10-CM | POA: Diagnosis not present

## 2015-06-02 DIAGNOSIS — D0501 Lobular carcinoma in situ of right breast: Secondary | ICD-10-CM | POA: Diagnosis not present

## 2015-06-02 DIAGNOSIS — E039 Hypothyroidism, unspecified: Secondary | ICD-10-CM

## 2015-06-02 DIAGNOSIS — N6091 Unspecified benign mammary dysplasia of right breast: Secondary | ICD-10-CM

## 2015-06-02 NOTE — Telephone Encounter (Signed)
Gave adn printed appt sched and avs for pt for NOV and April 2017 °

## 2015-06-02 NOTE — Progress Notes (Signed)
[place]  [phone] [phone]  Clinic New Consult Note   Patient Care Team: No Pcp Per Patient as PCP - General (General Practice) Jacelyn Pi, MD as Consulting Physician (Endocrinology) 06/02/2015  CHIEF COMPLAINTS/PURPOSE OF CONSULTATION:  Right breast atypical lobular hyperplasia and in situ carcinoma  HISTORY OF PRESENTING ILLNESS:  Christine Velez 49 y.o. female is here because of recently diagnosed right breast at typical lobular hyperplasia and in situ carcinoma.  It was discovered by screening mammogram and a core needle biopsy. She did not feel any mass, no pain or nipple discharge. She feels well overall. She has benign breast lesions when she was in colleague, which was resected. No family history of breast cancer. She underwent right breast lumpectomy on 04/12/2015 by Dr. Brantley Stage, which showed atypical lobular hyperplasia and in situ carcinoma. She has recovered well from the surgery. The   MEDICAL HISTORY:  Past Medical History  Diagnosis Date  . Thyroid disease     sees Dr. Chalmers Cater  . Hypothyroidism   . GERD (gastroesophageal reflux disease)   . Breast mass, right     carcinoma in situ    SURGICAL HISTORY: Past Surgical History  Procedure Laterality Date  . Lumbar disc surgery  12/2013    --Dr. Sherley Bounds  . Breast surgery      age 45--benign breast mass removed  . Lipoma removal  12/2009    left groin area--Dr. Trina Ao  . Breast lumpectomy with radioactive seed localization Right 04/16/2015    Procedure: RIGHT BREAST LUMPECTOMY WITH RADIOACTIVE SEED LOCALIZATION;  Surgeon: Erroll Luna, MD;  Location: [place];  Service: General;  Laterality: Right;   GYN HISTORY  Menarchal: 14 LMP: 3 weeks ago  Contraceptive: 14 years  HRT: n/a  G2P2:   SOCIAL HISTORY: Social History   Social History  . Marital Status: Married    Spouse Name: N/A  . Number of Children: 2, age of 77 and  37   . Years of Education: N/A   Occupational History  . Not on file.   Social History Main Topics  . Smoking status: Never Smoker   . Smokeless tobacco: Not on file  . Alcohol Use: 0.6 oz/week    1 Standard drinks or equivalent per week     Comment: social  . Drug Use: No  . Sexual Activity:    Partners: Male    Patent examiner Protection: None   Other Topics Concern  . Not on file   Social History Narrative    FAMILY HISTORY: Family History  Problem Relation Age of Onset  . Cancer Father 74    Dec-cancer unknown  origin  . Diabetes Father     AODM  . Cancer Paternal Grandmother     Dec--multiple myeloma  . Heart attack Maternal Grandfather     ALLERGIES:  has No Known Allergies.  MEDICATIONS:  Current Outpatient Prescriptions  Medication Sig Dispense Refill  . ALPRAZolam (XANAX) 0.5 MG tablet Take one tablet .5 mg every 8 hours as needed. 30 tablet 0  . levothyroxine (SYNTHROID, LEVOTHROID) 50 MCG tablet Take 50 mcg by mouth daily before breakfast.    . oxyCODONE-acetaminophen (ROXICET) 5-325 MG per tablet Take 1-2 tablets by mouth every 4 (four) hours as needed. 30 tablet 0   No current facility-administered medications for this visit.    REVIEW OF SYSTEMS:   Constitutional: Denies fevers, chills or abnormal night sweats Eyes: Denies blurriness of vision, double vision or  watery eyes Ears, nose, mouth, throat, and face: Denies mucositis or sore throat Respiratory: Denies cough, dyspnea or wheezes Cardiovascular: Denies palpitation, chest discomfort or lower extremity swelling Gastrointestinal:  Denies nausea, heartburn or change in bowel habits Skin: Denies abnormal skin rashes Lymphatics: Denies new lymphadenopathy or easy bruising Neurological:Denies numbness, tingling or new weaknesses Behavioral/Psych: Mood is stable, no new changes  All other systems were reviewed with the patient and are negative.  PHYSICAL EXAMINATION: ECOG PERFORMANCE STATUS: 0 -  Asymptomatic  Filed Vitals:   06/02/15 1121  BP: 109/57  Pulse: 91  Temp: 98.4 F (36.9 C)  Resp: 18   Filed Weights   06/02/15 1121  Weight: 202 lb 3.2 oz (91.717 kg)    GENERAL:alert, no distress and comfortable SKIN: skin color, texture, turgor are normal, no rashes or significant lesions EYES: normal, conjunctiva are pink and non-injected, sclera clear OROPHARYNX:no exudate, no erythema and lips, buccal mucosa, and tongue normal  NECK: supple, thyroid normal size, non-tender, without nodularity LYMPH:  no palpable lymphadenopathy in the cervical, axillary or inguinal LUNGS: clear to auscultation and percussion with normal breathing effort HEART: regular rate & rhythm and no murmurs and no lower extremity edema ABDOMEN:abdomen soft, non-tender and normal bowel sounds Musculoskeletal:no cyanosis of digits and no clubbing  PSYCH: alert & oriented x 3 with fluent speech NEURO: no focal motor/sensory deficits  LABORATORY DATA:  I have reviewed the data as listed Lab Results  Component Value Date   WBC 7.3 04/14/2015   HGB 13.1 04/16/2015   HCT 41.0 04/14/2015   MCV 90.7 04/14/2015   PLT 212 04/14/2015    Recent Labs  10/20/14 1459 04/14/15 1137  NA 137 138  K 4.0 4.6  CL 105 108  CO2 22 25  GLUCOSE 129* 106*  BUN 13 12  CREATININE 0.89 0.76  CALCIUM 9.0 8.8*  GFRNONAA 75* >60  GFRAA 87* >60  PROT 7.1 6.3*  ALBUMIN 4.1 3.8  AST 19 18  ALT 16 18  ALKPHOS 60 50  BILITOT 0.4 0.2*   PATHOLOGY REPORT Diagnosis 04/16/2015 Breast, lumpectomy, Right - COMPLEX SCLEROSING LESION WITH CALCIFICATIONS, SEE COMMENT. - LOBULAR NEOPLASIA (ATYPICAL LOBULAR HYPERPLASIA AND IN SITU CARCINOMA). Microscopic Comment The surgical resection margin(s) of the specimen were inked and microscopically evaluated. Multiple representative sections, include an entire 1.6 cm nodular tissue were submitted for review. Slide sections demonstrate benign complex sclerosing lesion with  associated fibrocystic change, columnar cell change, pseudoangiomatous stomal hyperplasia, usual ductal hyperplasia and microcalcifications. There are abundant foci of lobular neoplasia encompassing atypical lobular hyperplasia and lobular carcinoma in situ throughout the tissue sections. (CRR:gt, 04/17/15)  RADIOGRAPHIC STUDIES: I have personally reviewed the radiological images as listed and agreed with the findings in the report.  Diagnostic mammogram and ultrasound of the right breast 01/13/2015 FINDINGS: CC and MLO spot-compression tomosynthesis views of the right breast were performed. On the additional views, the possible distortion seen on screening mammogram persists but is slightly less prominent. No associated calcifications are identified.  Mammographic images were processed with CAD.  On physical exam, no discrete mass is felt within the area of concern within the lower, outer right breast.  Targeted ultrasound of the lower, outer right breast was performed demonstrating no definite sonographic correlate for the mammographic finding. No suspicious cystic or solid sonographic finding was identified.  IMPRESSION: Indeterminate architectural distortion seen on tomosynthesis views Only.  ASSESSMENT & PLAN:  49 year old pre-menopausal Caucasian female  #1 Right breast atypical lobular hyperplasia and in  situ carcinoma  -I discussed her image and surgical pathology results with her in great details. -We reviewed the natural history of atypical hyperplasia and LCIS. They are considered benign breast disease, however it does increase the risk of breast cancer by 3-5 fold. Both are considered as high risk for breast cancer. -We discussed annual screening mammogram, which will detect early stage breast cancer. She agrees to continue. She is very compliant on screening. i also encourage her to have monthly self breast exam, and see [place] for physical and breast exam very 6  months.  -Given her increased risk of breast cancer (>20%), it will be reasonable to have annual breast MRI also for screening, if it is covered by her insurance. -We also discussed healthy diet and regular exercise, calcium and vitamin D supplement, to reduce her risk of breast cancer -We further discussed chemoprevention to breast cancer. I discussed the option of tamoxifen, raloxifene and anastrozole. These endocrine therapy agent is likely going to reduce the risk of breast cancer by 30-40%, however there is no data of survival benefit so far.  -The different side effects of this 3 agents were discussed with patient in great details. Written material were provided to her.  -She is not very interested in chemoprevention, but will think about it.   #2 Bone health -she never had bone density scan before  -I encourage her to take vitamins D and calcium supplement -I discussed tamoxifen and raloxifene can increased her bone density.  Plan -breast MRI in the next few month -I will see her back in 6 months for follow up    All questions were answered. The patient knows to call the clinic with any problems, questions or concerns. I spent 40 minutes counseling the patient face to face. The total time spent in the appointment was 55 minutes and more than 50% was on counseling.     Truitt Merle, MD 06/02/2015 11:52 AM

## 2015-06-07 ENCOUNTER — Encounter: Payer: Self-pay | Admitting: Hematology

## 2015-06-07 DIAGNOSIS — D0501 Lobular carcinoma in situ of right breast: Secondary | ICD-10-CM | POA: Insufficient documentation

## 2015-06-07 DIAGNOSIS — N6091 Unspecified benign mammary dysplasia of right breast: Secondary | ICD-10-CM | POA: Insufficient documentation

## 2015-07-03 ENCOUNTER — Inpatient Hospital Stay: Admission: RE | Admit: 2015-07-03 | Payer: BLUE CROSS/BLUE SHIELD | Source: Ambulatory Visit

## 2015-10-30 ENCOUNTER — Telehealth: Payer: Self-pay | Admitting: Hematology

## 2015-10-30 NOTE — Telephone Encounter (Signed)
S/w pt to confirm 4/13 appt. Pt says she will be out of town. Gave appt for 4/18 @ 945am. Pt mentioned that she missed her MRI appt. I gave pt phone number to [facility] to r/s missed appt.

## 2015-11-10 ENCOUNTER — Inpatient Hospital Stay: Admission: RE | Admit: 2015-11-10 | Payer: BLUE CROSS/BLUE SHIELD | Source: Ambulatory Visit

## 2015-11-11 ENCOUNTER — Telehealth: Payer: Self-pay | Admitting: *Deleted

## 2015-11-11 NOTE — Telephone Encounter (Signed)
Spoke with pt and was informed by pt re:  Dr. Mosetta PuttFeng would like for pt to have Breast MRI in 6 months at last office visit.  [facility] that her insurance will not cover the procedure , and it will cost pt  $ 2000.00 out of pocket.    Pt would like to know if she could do 3D mammogram instead.    Message sent to Dr. Mosetta PuttFeng. Pt's    Phone    [phone].

## 2015-11-12 ENCOUNTER — Other Ambulatory Visit: Payer: Self-pay | Admitting: Hematology

## 2015-11-12 DIAGNOSIS — D0501 Lobular carcinoma in situ of right breast: Secondary | ICD-10-CM

## 2015-11-13 ENCOUNTER — Other Ambulatory Visit: Payer: Self-pay | Admitting: Hematology

## 2015-11-13 DIAGNOSIS — D0501 Lobular carcinoma in situ of right breast: Secondary | ICD-10-CM

## 2015-11-17 ENCOUNTER — Other Ambulatory Visit: Payer: Self-pay | Admitting: Hematology

## 2015-11-17 DIAGNOSIS — Z1231 Encounter for screening mammogram for malignant neoplasm of breast: Secondary | ICD-10-CM

## 2015-12-02 ENCOUNTER — Ambulatory Visit: Payer: BLUE CROSS/BLUE SHIELD | Admitting: Hematology

## 2015-12-03 ENCOUNTER — Ambulatory Visit: Payer: BLUE CROSS/BLUE SHIELD | Admitting: Hematology

## 2015-12-08 ENCOUNTER — Ambulatory Visit: Payer: BLUE CROSS/BLUE SHIELD | Admitting: Hematology

## 2015-12-09 ENCOUNTER — Telehealth: Payer: Self-pay | Admitting: *Deleted

## 2015-12-09 NOTE — Telephone Encounter (Signed)
Received call from pt stating that she doesn't know why she needs to be seen tomorrow.  She reports that her insurance would not cover a breast MRI & she couldn't handle the $2500 cost if she did it & she cant get a 3D mammo until May.  She has to get labs for another MD tomorrow & wants to cancel appts for tomorrow.  Appts cancelled & note to Dr Mosetta PuttFeng.

## 2015-12-10 ENCOUNTER — Other Ambulatory Visit: Payer: Self-pay | Admitting: Hematology

## 2015-12-10 ENCOUNTER — Ambulatory Visit: Payer: BLUE CROSS/BLUE SHIELD | Admitting: Hematology

## 2015-12-10 ENCOUNTER — Other Ambulatory Visit: Payer: BLUE CROSS/BLUE SHIELD | Admitting: Hematology

## 2015-12-16 ENCOUNTER — Telehealth: Payer: Self-pay | Admitting: *Deleted

## 2015-12-16 NOTE — Telephone Encounter (Signed)
Called pt on mobile # & informed that Dr Mosetta PuttFeng OK to cancel MRI & states that appt was a routine f/u & ask if she would like to come in 6 mo for f/u.  She reports that she will see her GYN tomorrow & will discuss with her & get back with us.

## 2015-12-18 ENCOUNTER — Ambulatory Visit (INDEPENDENT_AMBULATORY_CARE_PROVIDER_SITE_OTHER): Payer: BLUE CROSS/BLUE SHIELD | Admitting: Obstetrics and Gynecology

## 2015-12-18 ENCOUNTER — Encounter: Payer: Self-pay | Admitting: Obstetrics and Gynecology

## 2015-12-18 VITALS — BP 100/64 | HR 80 | Resp 18 | Ht 70.0 in | Wt 197.6 lb

## 2015-12-18 DIAGNOSIS — D0591 Unspecified type of carcinoma in situ of right breast: Secondary | ICD-10-CM | POA: Diagnosis not present

## 2015-12-18 DIAGNOSIS — Z01419 Encounter for gynecological examination (general) (routine) without abnormal findings: Secondary | ICD-10-CM

## 2015-12-18 DIAGNOSIS — R635 Abnormal weight gain: Secondary | ICD-10-CM

## 2015-12-18 MED ORDER — VALACYCLOVIR HCL 1 G PO TABS: ORAL_TABLET | ORAL | Status: DC

## 2015-12-18 NOTE — Progress Notes (Signed)
Patient ID: Christine Velez, female   DOB: [birth year], 50 y.o.   MRN: [id number] 50 y.o. [id number] Married Caucasian female here for annual exam.    New dx of carcinoma in situ of the right breast.  Did not do an MRI.  Seeing Dr. Chalmers Cater for thyroid regulation.  Struggling to achieve euthyroid status. Did routine labs - cholesterol and glucose which were normal.  Trying to loose weight.  Has checked into multiple programs and options.  Will see a nutritionist next week.  Did weight watchers in the past successfully. Currently doing a protein shake.  Wants Valtrex for oral HSV.  PCP:   None  Patient's last menstrual period was 12/07/2015 (exact date).     Period Cycle (Days): 30 Period Duration (Days): 5 Period Pattern: Regular Menstrual Flow:  (heavy x3 then taper) Menstrual Control: Tampon, Maxi pad Menstrual Control Change Freq (Hours): changes more for cleanliness than necessity Dysmenorrhea: (!) Mild Dysmenorrhea Symptoms: Cramping     Sexually active: Yes.   female The current method of family planning is none.   Natural family planning. Exercising: Yes.    walking. Smoker:  no  Health Maintenance: Pap:  12-03-14 Neg:Neg HR HPV History of abnormal Pap:  no MMG:  01-09-15  3D/C/Rt.br.distortion--Diag Rt.and [place] indeterminate architectural distortion seen--rec.Rt.br.bx--this was done 04-16-15 and revealed carcinoma in situ(See Epic).  Has May appointment.  Colonoscopy:  10/2014 polyp Dr. Lenise Herald 10/2019 BMD:   n/a  Result  n/a TDaP:  12-03-14 Gardasil:   N/A HIV:  Negative during pregnancy.  Screening Labs:  Hb today: Labs recently with Dr. Chalmers Cater, Urine today: unable to void   reports that she has never smoked. She does not have any smokeless tobacco history on file. She reports that she drinks about 0.6 oz of alcohol per week. She reports that she does not use illicit drugs.  Past Medical History  Diagnosis Date  . Thyroid disease     sees Dr. Chalmers Cater  . Hypothyroidism   .  GERD (gastroesophageal reflux disease)   . Breast mass, right     carcinoma in situ    Past Surgical History  Procedure Laterality Date  . Lumbar disc surgery  12/2013    --Dr. Sherley Bounds  . Breast surgery      age 73--benign breast mass removed  . Lipoma removal  12/2009    left groin area--Dr. Trina Ao  . Breast lumpectomy with radioactive seed localization Right 04/16/2015    Procedure: RIGHT BREAST LUMPECTOMY WITH RADIOACTIVE SEED LOCALIZATION;  Surgeon: Erroll Luna, MD;  Location: [place];  Service: General;  Laterality: Right;    Current Outpatient Prescriptions  Medication Sig Dispense Refill  . levothyroxine (SYNTHROID, LEVOTHROID) 100 MCG tablet Take 1 tablet by mouth daily.  5  . valACYclovir (VALTREX) 1000 MG tablet Take 1,000 mg by mouth as needed (takes as needed for cold sores). Reported on 12/18/2015     No current facility-administered medications for this visit.    Family History  Problem Relation Age of Onset  . Diabetes Father     AODM  . Prostate cancer Father   . Cancer Father 33    Dec-cancer unknown  origin, also hx of prostate cancer  . Cancer Paternal Grandmother     Dec--multiple myeloma  . Heart attack Maternal Grandfather     ROS:  Pertinent items are noted in HPI.  Otherwise, a comprehensive ROS was negative.  Exam:   BP 100/64 mmHg  Pulse 80  Resp 18  Ht '5\' 10"'$  (1.778 m)  Wt 197 lb 9.6 oz (89.631 kg)  BMI 28.35 kg/m2  LMP 12/07/2015 (Exact Date)    General appearance: alert, cooperative and appears stated age Head: Normocephalic, without obvious abnormality, atraumatic Neck: no adenopathy, supple, symmetrical, trachea midline and thyroid normal to inspection and palpation Lungs: clear to auscultation bilaterally Breasts: normal appearance, no masses or tenderness, Inspection negative, No nipple retraction or dimpling, No nipple discharge or bleeding, No axillary or supraclavicular adenopathy Heart:  regular rate and rhythm Abdomen: incisions:  No.   , soft, non-tender; no masses, no organomegaly Extremities: extremities normal, atraumatic, no cyanosis or edema Skin: Skin color, texture, turgor normal. No rashes or lesions Lymph nodes: Cervical, supraclavicular, and axillary nodes normal. No abnormal inguinal nodes palpated Neurologic: Grossly normal  Pelvic: External genitalia:  no lesions              Urethra:  normal appearing urethra with no masses, tenderness or lesions              Bartholins and Skenes: normal                 Vagina: normal appearing vagina with normal color and discharge, no lesions              Cervix: no lesions              Pap taken: No. Bimanual Exam:  Uterus:  normal size, contour, position, consistency, mobility, non-tender              Adnexa: normal adnexa and no mass, fullness, tenderness              Rectal exam: Yes.  .  Confirms.              Anus:  normal sphincter tone, no lesions  Chaperone was present for exam.  Assessment:   Well woman visit with normal exam. Right breast carcinoma in situ.  Status post lumpectomy.  Weight gain.   Hypothyroid.  Care through endocrinology.  Oral HSV.  Plan: Yearly mammogram recommended after age 89.  Recommended self breast exam.  Pap and HR HPV as above. Recommendations for Calcium, Vitamin D, regular exercise program including cardiovascular and weight bearing exercise. Labs performed.  No..   Prescription medication(s) given.  Yes.  Valtrex.  Follow up annually and prn.   Additional counseling given.  Yes.  . __15_____ minutes face to face time of which over 50% was spent in counseling regarding personal hx of DCIS and weight gain.  Will refer to genetics counselor to review personal and family history.  May choose to do genetic testing for risk stratification. Dicussed tx for breast cancer reduction risk which can be done now or at any time, such as when she becomes menopausal.  Dicussed of  successful weight loss through portion control and exercise.  This discussion was more than just a well woman visit to cover these topics.  After visit summary provided.

## 2015-12-18 NOTE — Patient Instructions (Signed)

## 2015-12-23 ENCOUNTER — Telehealth: Payer: Self-pay | Admitting: Genetic Counselor

## 2015-12-23 NOTE — Telephone Encounter (Signed)
Lt mess regarding genetic counseling referral °

## 2016-01-08 ENCOUNTER — Telehealth: Payer: Self-pay | Admitting: Obstetrics and Gynecology

## 2016-01-08 NOTE — Telephone Encounter (Signed)
Spoke with patient on 12/28/15 and provided scheduling information for referral to genetic counseling for carcinoma in situ. As of current date, patient does not show appointment in her appointment desk. Called to follow up with patient. Left voicemail.

## 2016-01-20 ENCOUNTER — Ambulatory Visit
Admission: RE | Admit: 2016-01-20 | Discharge: 2016-01-20 | Disposition: A | Discharge status: Home / Self Care | Payer: BLUE CROSS/BLUE SHIELD | Source: Ambulatory Visit | Attending: Hematology | Admitting: Hematology

## 2016-01-20 DIAGNOSIS — Z1231 Encounter for screening mammogram for malignant neoplasm of breast: Secondary | ICD-10-CM

## 2016-01-26 ENCOUNTER — Other Ambulatory Visit: Payer: Self-pay

## 2016-01-26 DIAGNOSIS — Z853 Personal history of malignant neoplasm of breast: Secondary | ICD-10-CM

## 2016-03-25 DIAGNOSIS — N959 Unspecified menopausal and perimenopausal disorder: Secondary | ICD-10-CM | POA: Diagnosis not present

## 2016-03-25 DIAGNOSIS — K219 Gastro-esophageal reflux disease without esophagitis: Secondary | ICD-10-CM | POA: Diagnosis not present

## 2016-03-25 DIAGNOSIS — E039 Hypothyroidism, unspecified: Secondary | ICD-10-CM | POA: Diagnosis not present

## 2016-03-25 DIAGNOSIS — R5383 Other fatigue: Secondary | ICD-10-CM | POA: Diagnosis not present

## 2016-03-25 DIAGNOSIS — E063 Autoimmune thyroiditis: Secondary | ICD-10-CM | POA: Diagnosis not present

## 2016-04-08 DIAGNOSIS — D2262 Melanocytic nevi of left upper limb, including shoulder: Secondary | ICD-10-CM | POA: Diagnosis not present

## 2016-04-08 DIAGNOSIS — L820 Inflamed seborrheic keratosis: Secondary | ICD-10-CM | POA: Diagnosis not present

## 2016-05-09 DIAGNOSIS — H524 Presbyopia: Secondary | ICD-10-CM | POA: Diagnosis not present

## 2016-05-09 DIAGNOSIS — H5203 Hypermetropia, bilateral: Secondary | ICD-10-CM | POA: Diagnosis not present

## 2016-05-30 DIAGNOSIS — Z23 Encounter for immunization: Secondary | ICD-10-CM | POA: Diagnosis not present

## 2016-06-02 DIAGNOSIS — E039 Hypothyroidism, unspecified: Secondary | ICD-10-CM | POA: Diagnosis not present

## 2016-06-08 DIAGNOSIS — E039 Hypothyroidism, unspecified: Secondary | ICD-10-CM | POA: Diagnosis not present

## 2016-06-08 DIAGNOSIS — E063 Autoimmune thyroiditis: Secondary | ICD-10-CM | POA: Diagnosis not present

## 2016-06-08 DIAGNOSIS — E669 Obesity, unspecified: Secondary | ICD-10-CM | POA: Diagnosis not present

## 2016-11-29 DIAGNOSIS — E663 Overweight: Secondary | ICD-10-CM | POA: Diagnosis not present

## 2016-11-29 DIAGNOSIS — E039 Hypothyroidism, unspecified: Secondary | ICD-10-CM | POA: Diagnosis not present

## 2016-11-29 DIAGNOSIS — Z0000 Encounter for general adult medical examination without abnormal findings: Secondary | ICD-10-CM | POA: Diagnosis not present

## 2016-12-07 DIAGNOSIS — E039 Hypothyroidism, unspecified: Secondary | ICD-10-CM | POA: Diagnosis not present

## 2016-12-22 ENCOUNTER — Ambulatory Visit (INDEPENDENT_AMBULATORY_CARE_PROVIDER_SITE_OTHER): Payer: BLUE CROSS/BLUE SHIELD | Admitting: Obstetrics and Gynecology

## 2016-12-22 ENCOUNTER — Encounter: Payer: Self-pay | Admitting: Obstetrics and Gynecology

## 2016-12-22 VITALS — BP 104/72 | HR 76 | Resp 16 | Ht 70.25 in | Wt 205.0 lb

## 2016-12-22 DIAGNOSIS — E663 Overweight: Secondary | ICD-10-CM | POA: Diagnosis not present

## 2016-12-22 DIAGNOSIS — Z01419 Encounter for gynecological examination (general) (routine) without abnormal findings: Secondary | ICD-10-CM | POA: Diagnosis not present

## 2016-12-22 DIAGNOSIS — Z86000 Personal history of in-situ neoplasm of breast: Secondary | ICD-10-CM | POA: Diagnosis not present

## 2016-12-22 NOTE — Patient Instructions (Signed)

## 2016-12-22 NOTE — Progress Notes (Signed)
51 y.o. [id number] MarriedCaucasianF here for annual exam.  H/O lobular carcinoma in situ of the right breast.  Menses q month x 4 days, saturates a maxi-pad in 3 hours, getting heavier over time. She has tolerable cramps. Sexually active, no contraception for 16 years. No pain.   She reports mixed incontinence, leaks a few drips a couple of times a week. Voids normal amounts. So far symptoms are tolerable. Typically full when she leaks. She drinks about 12 oz of coffee every am. Otherwise just drinks water. Intermittent ETOH use.   She is trying to loose weight. She just started care with a new primary.     Patient's last menstrual period was 12/11/2016.          Sexually active: Yes.    The current method of family planning is none.    Exercising: Yes.    walking Smoker:  no  Health Maintenance: Pap:  12/03/14 WNL Neg HR HPV  5.2/13 WNL History of abnormal Pap:  no MMG:  01/20/16 BIRADS 1 negative Colonoscopy:   10/2014 polyp Dr. Lenise Herald 10/2019 BMD:   n/a TDaP:  12/03/14 Gardasil: N/a   reports that she has never smoked. She has never used smokeless tobacco. She reports that she drinks about 0.6 oz of alcohol per week . She reports that she does not use drugs.She works in [employer], loves it. Daughter is 53 and son is 77.   Past Medical History:  Diagnosis Date  . Breast mass, right    carcinoma in situ  . GERD (gastroesophageal reflux disease)   . Hypothyroidism   . Thyroid disease    sees Dr. Chalmers Cater    Past Surgical History:  Procedure Laterality Date  . BREAST LUMPECTOMY WITH RADIOACTIVE SEED LOCALIZATION Right 04/16/2015   Procedure: RIGHT BREAST LUMPECTOMY WITH RADIOACTIVE SEED LOCALIZATION;  Surgeon: Erroll Luna, MD;  Location: [place];  Service: General;  Laterality: Right;  . BREAST SURGERY     age 6--benign breast mass removed  . lipoma removal  12/2009   left groin area--Dr. Trina Ao  . LUMBAR DISC SURGERY  12/2013   --Dr. Sherley Bounds    Current Outpatient Prescriptions  Medication Sig Dispense Refill  . Levothyroxine Sodium 112 MCG CAPS Take 1 tablet by mouth daily.  5  . omeprazole (PRILOSEC) 40 MG capsule Take 40 mg by mouth daily.    . valACYclovir (VALTREX) 1000 MG tablet Take 2 tablets (2000 mg) by mouth every 12 hours for 24 hours prn cold sores. 30 tablet 1   No current facility-administered medications for this visit.     Family History  Problem Relation Age of Onset  . Diabetes Father     AODM  . Prostate cancer Father   . Cancer Father 50    Dec-cancer unknown  origin, also hx of prostate cancer  . Cancer Paternal Grandmother     Dec--multiple myeloma  . Heart attack Maternal Grandfather   Father had metestatic cancer, not sure of the primary. He did have h/o prostate cancer 8 years prior. He died at 61.   Review of Systems  Constitutional: Negative.   HENT: Negative.   Eyes: Negative.   Respiratory: Negative.   Cardiovascular: Negative.   Gastrointestinal: Negative.   Endocrine: Negative.   Genitourinary: Negative.   Musculoskeletal: Negative.   Skin: Negative.   Allergic/Immunologic: Negative.   Neurological: Negative.   Hematological: Negative.   Psychiatric/Behavioral: Negative.     Exam:   BP 104/72 (BP  Location: Right Arm, Patient Position: Sitting, Cuff Size: Normal)   Pulse 76   Resp 16   Ht 5' 10.25" (1.784 m)   Wt 205 lb (93 kg)   LMP 12/11/2016   BMI 29.21 kg/m   Weight change: '@WEIGHTCHANGE'$ @ Height:   Height: 5' 10.25" (178.4 cm)  Ht Readings from Last 3 Encounters:  12/22/16 5' 10.25" (1.784 m)  12/18/15 '5\' 10"'$  (1.778 m)  06/02/15 '5\' 11"'$  (1.803 m)    General appearance: alert, cooperative and appears stated age Head: Normocephalic, without obvious abnormality, atraumatic Neck: no adenopathy, supple, symmetrical, trachea midline and thyroid normal to inspection and palpation Lungs: clear to auscultation bilaterally Cardiovascular: regular rate and  rhythm Breasts: normal appearance, no masses or tenderness Abdomen: soft, non-tender; bowel sounds normal; no masses,  no organomegaly Extremities: extremities normal, atraumatic, no cyanosis or edema Skin: Skin color, texture, turgor normal. No rashes or lesions Lymph nodes: Cervical, supraclavicular, and axillary nodes normal. No abnormal inguinal nodes palpated Neurologic: Grossly normal   Pelvic: External genitalia:  no lesions              Urethra:  normal appearing urethra with no masses, tenderness or lesions              Bartholins and Skenes: normal                 Vagina: normal appearing vagina with normal color and discharge, no lesions              Cervix: no lesions               Bimanual Exam:  Uterus:  normal size, contour, position, consistency, mobility, non-tender              Adnexa: no mass, fullness, tenderness               Rectovaginal: Confirms               Anus:  normal sphincter tone, no lesions  Chaperone was present for exam.  A:  Well Woman with normal exam  H/O lobular carcinoma in situ of the right breast, puts her at increased risk of breast cancer  P:   Pap next year  Refer to genetic counseling to discuss options  Discussed weight loss, weight watchers and exercise, handout given for [facility]  Discussed breast self exam  Discussed calcium and vit D intake

## 2017-01-04 ENCOUNTER — Ambulatory Visit: Payer: BLUE CROSS/BLUE SHIELD | Admitting: Obstetrics and Gynecology

## 2017-01-05 ENCOUNTER — Encounter: Payer: Self-pay | Admitting: Genetic Counselor

## 2017-01-05 ENCOUNTER — Telehealth: Payer: Self-pay | Admitting: Genetic Counselor

## 2017-01-05 NOTE — Telephone Encounter (Signed)
Appt has been scheduled for the pt to see Maylon CosKaren Powell on 6/11 at 9am. Pt agreed to the appt date and time. Aware to arrive 15 minutes early. Letter mailed.

## 2017-01-09 ENCOUNTER — Telehealth: Payer: Self-pay | Admitting: Obstetrics and Gynecology

## 2017-01-09 NOTE — Telephone Encounter (Signed)
Spoke with patient and gave information as below per Dr. Oscar LaJertson. Patient voiced understanding -eh

## 2017-01-09 NOTE — Telephone Encounter (Signed)
Please let the patient know that I did some inquiring about the [facility]. There is some flexibility on how to do the plan. For example she could do one protein shake a day and then follow the dietary guidelines and exercise. Completely up to her, just wanted her to know.

## 2017-01-18 ENCOUNTER — Other Ambulatory Visit: Payer: Self-pay | Admitting: Obstetrics and Gynecology

## 2017-01-18 DIAGNOSIS — Z1231 Encounter for screening mammogram for malignant neoplasm of breast: Secondary | ICD-10-CM

## 2017-01-24 DIAGNOSIS — E063 Autoimmune thyroiditis: Secondary | ICD-10-CM | POA: Diagnosis not present

## 2017-01-24 DIAGNOSIS — K219 Gastro-esophageal reflux disease without esophagitis: Secondary | ICD-10-CM | POA: Diagnosis not present

## 2017-01-24 DIAGNOSIS — Z6827 Body mass index (BMI) 27.0-27.9, adult: Secondary | ICD-10-CM | POA: Diagnosis not present

## 2017-01-24 DIAGNOSIS — E663 Overweight: Secondary | ICD-10-CM | POA: Diagnosis not present

## 2017-01-30 ENCOUNTER — Other Ambulatory Visit: Payer: BLUE CROSS/BLUE SHIELD

## 2017-01-30 ENCOUNTER — Encounter: Payer: BLUE CROSS/BLUE SHIELD | Admitting: Genetic Counselor

## 2017-02-01 ENCOUNTER — Other Ambulatory Visit: Payer: BLUE CROSS/BLUE SHIELD

## 2017-02-01 ENCOUNTER — Encounter: Payer: BLUE CROSS/BLUE SHIELD | Admitting: Genetic Counselor

## 2017-02-08 ENCOUNTER — Ambulatory Visit: Payer: BLUE CROSS/BLUE SHIELD

## 2017-02-09 DIAGNOSIS — Z713 Dietary counseling and surveillance: Secondary | ICD-10-CM | POA: Diagnosis not present

## 2017-02-09 DIAGNOSIS — E663 Overweight: Secondary | ICD-10-CM | POA: Diagnosis not present

## 2017-02-10 ENCOUNTER — Ambulatory Visit
Admission: RE | Admit: 2017-02-10 | Discharge: 2017-02-10 | Disposition: A | Discharge status: Home / Self Care | Payer: BLUE CROSS/BLUE SHIELD | Source: Ambulatory Visit | Attending: Obstetrics and Gynecology | Admitting: Obstetrics and Gynecology

## 2017-02-10 DIAGNOSIS — Z1231 Encounter for screening mammogram for malignant neoplasm of breast: Secondary | ICD-10-CM | POA: Diagnosis not present

## 2017-02-13 ENCOUNTER — Other Ambulatory Visit: Payer: Self-pay | Admitting: Obstetrics and Gynecology

## 2017-02-13 DIAGNOSIS — R928 Other abnormal and inconclusive findings on diagnostic imaging of breast: Secondary | ICD-10-CM

## 2017-02-15 ENCOUNTER — Other Ambulatory Visit: Payer: Self-pay | Admitting: Obstetrics and Gynecology

## 2017-02-15 ENCOUNTER — Ambulatory Visit
Admission: RE | Admit: 2017-02-15 | Discharge: 2017-02-15 | Disposition: A | Discharge status: Home / Self Care | Payer: BLUE CROSS/BLUE SHIELD | Source: Ambulatory Visit | Attending: Obstetrics and Gynecology | Admitting: Obstetrics and Gynecology

## 2017-02-15 DIAGNOSIS — N63 Unspecified lump in unspecified breast: Secondary | ICD-10-CM

## 2017-02-15 DIAGNOSIS — R928 Other abnormal and inconclusive findings on diagnostic imaging of breast: Secondary | ICD-10-CM | POA: Diagnosis not present

## 2017-02-15 DIAGNOSIS — N6310 Unspecified lump in the right breast, unspecified quadrant: Secondary | ICD-10-CM | POA: Diagnosis not present

## 2017-02-16 ENCOUNTER — Telehealth: Payer: Self-pay

## 2017-02-16 DIAGNOSIS — R922 Inconclusive mammogram: Secondary | ICD-10-CM

## 2017-02-16 DIAGNOSIS — N6310 Unspecified lump in the right breast, unspecified quadrant: Secondary | ICD-10-CM

## 2017-02-16 DIAGNOSIS — Z86000 Personal history of in-situ neoplasm of breast: Secondary | ICD-10-CM

## 2017-02-16 NOTE — Telephone Encounter (Signed)
Left message to call Shevon Sian at [phone]. 

## 2017-02-16 NOTE — Telephone Encounter (Signed)
-----   Message from Romualdo BolkJill Evelyn Jertson, MD sent at 02/15/2017  5:36 PM EDT ----- Please take out of mammogram hold and put in recall for 6 months for right diagnostic mammogram and ultrasound. A referral was placed for genetic counseling, but she never went, can you please check with her on this. Please also set her up for breast mri (see radiologists recommendation)

## 2017-02-16 NOTE — Telephone Encounter (Signed)
Left message to call [name] at [phone]. 

## 2017-02-16 NOTE — Telephone Encounter (Signed)
Spoke with patient. Patient states that she was out of town when she was scheduled to have an appointment with genetics. Did not call to reschedule, but would like to proceed at this time. New referral placed. Patient is aware she will be contacted to schedule appointment. Patient would like to proceed with breast MRI. Aware this will be ordered and pre-authorization will be completed. She will be contacted to schedule. Patient is agreeable.   Cc: Harland DingwallSuzy Dixon

## 2017-02-16 NOTE — Telephone Encounter (Signed)
Patient returned call

## 2017-02-21 NOTE — Telephone Encounter (Signed)
Patient removed from mammogram hold and placed in 6 month recall. Patient has been placed in imaging hold.  Routing to provider for final review. Patient agreeable to disposition. Will close encounter.

## 2017-02-21 NOTE — Telephone Encounter (Signed)
Received call from Bufford Lopeosalyn G, with [facility] [facility]. Rosalyn advised the requested Bilateral Breast MRI w w/o  Contrast is approved.  The MRI authorization number is [id number] and is vaild 02/17/17 through 03/18/17. Patient is scheduled 03/02/17 and the approval information has been added to the appointment note.  Routing to Dr Oscar LaJertson  cc: Nolen MuKaitlyn Sprague, RN

## 2017-03-02 ENCOUNTER — Other Ambulatory Visit: Payer: BLUE CROSS/BLUE SHIELD

## 2017-03-06 ENCOUNTER — Telehealth: Payer: Self-pay | Admitting: Obstetrics and Gynecology

## 2017-03-06 NOTE — Telephone Encounter (Signed)
Call placed to patient to update her on referral status and provide number to schedule with Genetics department. Left voicemail for return call. Her appointment notes show she cancelled her appointment for 01-30-17 and was a no-show for her appointment on 02-01-17. Her referral is deferred to follow up with patient.    Routing to provider for review. Will close encounter.

## 2017-03-24 DIAGNOSIS — E063 Autoimmune thyroiditis: Secondary | ICD-10-CM | POA: Diagnosis not present

## 2017-03-24 DIAGNOSIS — E039 Hypothyroidism, unspecified: Secondary | ICD-10-CM | POA: Diagnosis not present

## 2017-03-24 DIAGNOSIS — K210 Gastro-esophageal reflux disease with esophagitis: Secondary | ICD-10-CM | POA: Diagnosis not present

## 2017-03-24 DIAGNOSIS — E669 Obesity, unspecified: Secondary | ICD-10-CM | POA: Diagnosis not present

## 2017-03-28 ENCOUNTER — Telehealth: Payer: Self-pay | Admitting: *Deleted

## 2017-03-28 NOTE — Telephone Encounter (Signed)
Spoke with patient. Patient states she does not plan to reschedule MRI at this time d/t cost. Patient states she discussed with nurse and physician at [facility] the need for MRI and was advised "nothing alarming",would be precautionary, can f/u with diagnostic screening in 6 months.  Patient scheduled with [facility] on 09/04/17 for f/u imaging.  Advised patient would update Dr. Oscar LaJertson and return call with any additional recommendations, patient is agreeable.   Routing to provider for final review. Patient is agreeable to disposition. Will close encounter.  Cc: Harland DingwallSuzy Dixon

## 2017-03-28 NOTE — Telephone Encounter (Signed)
Left message to call Noreene LarssonJill at [phone].   Dr. Oscar LaJertson request to f/u with patient regarding recommended bilateral breast MRI cancelled at [facility].

## 2017-04-16 DIAGNOSIS — Z23 Encounter for immunization: Secondary | ICD-10-CM | POA: Diagnosis not present

## 2017-06-15 DIAGNOSIS — L819 Disorder of pigmentation, unspecified: Secondary | ICD-10-CM | POA: Diagnosis not present

## 2017-06-15 DIAGNOSIS — L821 Other seborrheic keratosis: Secondary | ICD-10-CM | POA: Diagnosis not present

## 2017-06-15 DIAGNOSIS — D2371 Other benign neoplasm of skin of right lower limb, including hip: Secondary | ICD-10-CM | POA: Diagnosis not present

## 2017-06-15 DIAGNOSIS — D2262 Melanocytic nevi of left upper limb, including shoulder: Secondary | ICD-10-CM | POA: Diagnosis not present

## 2017-06-15 DIAGNOSIS — L820 Inflamed seborrheic keratosis: Secondary | ICD-10-CM | POA: Diagnosis not present

## 2017-06-16 DIAGNOSIS — H5203 Hypermetropia, bilateral: Secondary | ICD-10-CM | POA: Diagnosis not present

## 2017-08-25 ENCOUNTER — Ambulatory Visit
Admission: RE | Admit: 2017-08-25 | Discharge: 2017-08-25 | Disposition: A | Discharge status: Home / Self Care | Payer: BLUE CROSS/BLUE SHIELD | Source: Ambulatory Visit | Attending: Obstetrics and Gynecology | Admitting: Obstetrics and Gynecology

## 2017-08-25 DIAGNOSIS — R922 Inconclusive mammogram: Secondary | ICD-10-CM | POA: Diagnosis not present

## 2017-08-25 DIAGNOSIS — N63 Unspecified lump in unspecified breast: Secondary | ICD-10-CM

## 2017-08-25 DIAGNOSIS — N6312 Unspecified lump in the right breast, upper inner quadrant: Secondary | ICD-10-CM | POA: Diagnosis not present

## 2017-11-14 DIAGNOSIS — E663 Overweight: Secondary | ICD-10-CM | POA: Diagnosis not present

## 2017-11-14 DIAGNOSIS — E063 Autoimmune thyroiditis: Secondary | ICD-10-CM | POA: Diagnosis not present

## 2017-11-14 DIAGNOSIS — Z713 Dietary counseling and surveillance: Secondary | ICD-10-CM | POA: Diagnosis not present

## 2017-11-14 DIAGNOSIS — Z6828 Body mass index (BMI) 28.0-28.9, adult: Secondary | ICD-10-CM | POA: Diagnosis not present

## 2017-12-06 ENCOUNTER — Telehealth: Payer: Self-pay | Admitting: Obstetrics and Gynecology

## 2017-12-06 NOTE — Telephone Encounter (Signed)
Patient called stating that she was having pain in one of her breasts. She called [facility] and they told her that an order had to come from her GYN.

## 2017-12-06 NOTE — Telephone Encounter (Signed)
Spoke with patient. Patient states that she is having right breast pain. Pain began last week, but is worsening. Denies any redness, swelling, warmth, or masses in breast. Advised will need to be seen for further evaluation with Dr.Jertson. Patient is agreeable. Appointment scheduled for 12/07/2017 at 1:45 pm. Patient is agreeable to date and time.  Routing to provider for final review. Patient agreeable to disposition. Will close encounter.

## 2017-12-07 ENCOUNTER — Other Ambulatory Visit: Payer: Self-pay

## 2017-12-07 ENCOUNTER — Encounter: Payer: Self-pay | Admitting: Obstetrics and Gynecology

## 2017-12-07 ENCOUNTER — Ambulatory Visit: Payer: BLUE CROSS/BLUE SHIELD | Admitting: Obstetrics and Gynecology

## 2017-12-07 VITALS — BP 100/62 | HR 70 | Resp 16 | Ht 70.25 in | Wt 206.0 lb

## 2017-12-07 DIAGNOSIS — N644 Mastodynia: Secondary | ICD-10-CM

## 2017-12-07 DIAGNOSIS — N912 Amenorrhea, unspecified: Secondary | ICD-10-CM

## 2017-12-07 DIAGNOSIS — Z9189 Other specified personal risk factors, not elsewhere classified: Secondary | ICD-10-CM | POA: Diagnosis not present

## 2017-12-07 LAB — POCT URINE PREGNANCY: Preg Test, Ur: NEGATIVE

## 2017-12-07 NOTE — Patient Instructions (Signed)

## 2017-12-07 NOTE — Progress Notes (Signed)
GYNECOLOGY  VISIT   HPI: 52 y.o.   Married  Caucasian  female   G2P2002 with Patient's last menstrual period was 06/10/2017 (exact date).   here c/o a one week h/o severe Rt breast pain, having sharp pain deep in the breast.  She just had imaging in 1/19. She has multiple "masses" in the right breast, all c/w fibrocystic changes.  No caffeine intake.  The patient has a h/o lobular carcinoma in situ of the right breast.  No menses for 6 months. Prior to that her cycles were irregular, then regular for several months prior to stopping.  She is having vasomotor symptoms. No vaginal dryness.   GYNECOLOGIC HISTORY: Patient's last menstrual period was 06/10/2017 (exact date). Contraception:none Menopausal hormone therapy: none        OB History    Gravida  2   Para  2   Term  2   Preterm      AB      Living  2     SAB      TAB      Ectopic      Multiple      Live Births                 Patient Active Problem List   Diagnosis Date Noted  . Lobular carcinoma in situ of right breast 06/07/2015  . Atypical lobular hyperplasia of right breast 06/07/2015  . UPPER RESPIRATORY INFECTION, ACUTE, WITH BRONCHITIS 10/05/2009  . SLEEPLESSNESS 03/19/2008  . HEADACHE 03/19/2008  . SINUSITIS- ACUTE-NOS 06/18/2007    Past Medical History:  Diagnosis Date  . Breast mass, right    carcinoma in situ  . GERD (gastroesophageal reflux disease)   . Hypothyroidism   . Thyroid disease    sees Dr. Chalmers Cater    Past Surgical History:  Procedure Laterality Date  . BREAST LUMPECTOMY WITH RADIOACTIVE SEED LOCALIZATION Right 04/16/2015   Procedure: RIGHT BREAST LUMPECTOMY WITH RADIOACTIVE SEED LOCALIZATION;  Surgeon: Erroll Luna, MD;  Location: [employer];  Service: General;  Laterality: Right;  . BREAST SURGERY     age 87--benign breast mass removed  . lipoma removal  12/2009   left groin area--Dr. Trina Ao  . LUMBAR DISC SURGERY  12/2013   --Dr. Sherley Bounds    Current Outpatient Medications  Medication Sig Dispense Refill  . Levothyroxine Sodium 112 MCG CAPS Take 1 tablet by mouth daily.  5  . omeprazole (PRILOSEC) 40 MG capsule Take 40 mg by mouth as needed.     . valACYclovir (VALTREX) 1000 MG tablet Take 2 tablets (2000 mg) by mouth every 12 hours for 24 hours prn cold sores. 30 tablet 1   No current facility-administered medications for this visit.      ALLERGIES: Patient has no known allergies.  Family History  Problem Relation Age of Onset  . Diabetes Father        AODM  . Prostate cancer Father   . Cancer Father 38       Dec-cancer unknown  origin, also hx of prostate cancer  . Cancer Paternal Grandmother        Dec--multiple myeloma  . Heart attack Maternal Grandfather     Social History   Socioeconomic History  . Marital status: Married    Spouse name: Not on file  . Number of children: Not on file  . Years of education: Not on file  . Highest education level: Not on file  Occupational History  .  Not on file  Social Needs  . Financial resource strain: Not on file  . Food insecurity:    Worry: Not on file    Inability: Not on file  . Transportation needs:    Medical: Not on file    Non-medical: Not on file  Tobacco Use  . Smoking status: Never Smoker  . Smokeless tobacco: Never Used  Substance and Sexual Activity  . Alcohol use: Yes    Alcohol/week: 0.6 oz    Types: 1 Standard drinks or equivalent per week    Comment: social  . Drug use: No  . Sexual activity: Yes    Partners: Male    Birth control/protection: None  Lifestyle  . Physical activity:    Days per week: Not on file    Minutes per session: Not on file  . Stress: Not on file  Relationships  . Social connections:    Talks on phone: Not on file    Gets together: Not on file    Attends religious service: Not on file    Active member of club or organization: Not on file    Attends meetings of clubs or organizations: Not on file     Relationship status: Not on file  . Intimate partner violence:    Fear of current or ex partner: Not on file    Emotionally abused: Not on file    Physically abused: Not on file    Forced sexual activity: Not on file  Other Topics Concern  . Not on file  Social History Narrative  . Not on file    Review of Systems  Constitutional: Negative.   HENT: Negative.   Eyes: Negative.   Respiratory: Negative.   Cardiovascular: Negative.   Gastrointestinal: Negative.   Genitourinary: Negative.   Musculoskeletal: Negative.   Skin:       Right breast pain  Neurological: Negative.   Endo/Heme/Allergies: Negative.   Psychiatric/Behavioral: Negative.     PHYSICAL EXAMINATION:    BP 100/62   Pulse 70   Resp 16   Ht 5' 10.25" (1.784 m)   Wt 206 lb (93.4 kg)   LMP 06/10/2017 (Exact Date)   BMI 29.35 kg/m     General appearance: alert, cooperative and appears stated age Breasts: no masses, very tender on the right. Fibrocystic changes in the right breast at 12 o'clock.   No axillary or supraclavicular adenopathy  ASSESSMENT Severe right mastalgia, just in the last week Amenorrhea Elevated risk of breast cancer, overdue for breast mri    PLAN Set up breast MRI UPT negative TSH, FSH, estradiol If her breast pain persists will add mammogram and u/s (due for f/u in 7/19) Call with any further bleeding, would further evaluate Information on managing breast pain given She can use OTC NSAID's, declines a script     An After Visit Summary was printed and given to the patient.

## 2017-12-08 LAB — ESTRADIOL: Estradiol: 172.8 pg/mL

## 2017-12-08 LAB — TSH: TSH: 0.677 u[IU]/mL (ref 0.450–4.500)

## 2017-12-08 LAB — FOLLICLE STIMULATING HORMONE: FSH: 19.8 m[IU]/mL

## 2017-12-12 ENCOUNTER — Telehealth: Payer: Self-pay | Admitting: *Deleted

## 2017-12-12 NOTE — Telephone Encounter (Signed)
Left message to call regarding lab results -eh 

## 2017-12-12 NOTE — Telephone Encounter (Signed)
-----   Message from Romualdo BolkJill Evelyn Jertson, MD sent at 12/08/2017  2:30 PM EDT ----- Please let the patient know that her thyroid is normal, her FSH is in a pre-menopausal range, her estradiol is elevated and suggests that she has ovulated and will get a cycle. I'm guessing that's why her breast is so tender. I wouldn't be surprised if she had a heavy cycle. She should call and let us know when she bleeds. Because she has gone so long without bleeding she really should have a biopsy when she bleeds.

## 2017-12-13 ENCOUNTER — Telehealth: Payer: Self-pay

## 2017-12-13 DIAGNOSIS — Z86000 Personal history of in-situ neoplasm of breast: Secondary | ICD-10-CM

## 2017-12-13 DIAGNOSIS — Z9189 Other specified personal risk factors, not elsewhere classified: Secondary | ICD-10-CM

## 2017-12-13 NOTE — Telephone Encounter (Signed)
-----   Message from Romualdo BolkJill Evelyn Jertson, MD sent at 12/07/2017  5:23 PM EDT ----- Please set this patient up for breast MRI, she has an over 20% lifetime risk and is overdue She is also having new right sided mastalgia  Thanks, Noreene LarssonJill

## 2017-12-13 NOTE — Telephone Encounter (Signed)
Order for bilateral breast MRI w wo contrast ordered for preauthorization.  Routing to PraxairSuzy Dixon

## 2017-12-13 NOTE — Telephone Encounter (Signed)
Nolen MuKaitlyn Sprague, RN  and I have spoken with [facility] and obtained authorization for the recommended bilateral breast MRI.Marland Kitchen. The approval number is [id number]. The approval is valid 12/13/17 through 01/11/18. I have spoken with Drenda FreezeFran with [facility] to convey this information. Drenda FreezeFran has noted this information and advises they will call patient for scheduling.   cc: Dr Oscar LaJertson  cc: Nolen MuKaitlyn Sprague, RN

## 2017-12-21 ENCOUNTER — Ambulatory Visit
Admission: RE | Admit: 2017-12-21 | Discharge: 2017-12-21 | Disposition: A | Discharge status: Home / Self Care | Payer: BLUE CROSS/BLUE SHIELD | Source: Ambulatory Visit | Attending: Obstetrics and Gynecology | Admitting: Obstetrics and Gynecology

## 2017-12-21 DIAGNOSIS — Z853 Personal history of malignant neoplasm of breast: Secondary | ICD-10-CM | POA: Diagnosis not present

## 2017-12-21 DIAGNOSIS — Z9189 Other specified personal risk factors, not elsewhere classified: Secondary | ICD-10-CM

## 2017-12-21 DIAGNOSIS — Z86000 Personal history of in-situ neoplasm of breast: Secondary | ICD-10-CM

## 2017-12-21 MED ORDER — GADOBENATE DIMEGLUMINE 529 MG/ML IV SOLN
15.0000 mL | Freq: Once | INTRAVENOUS | Status: AC | PRN
  Administered 2017-12-21: 15 mL via INTRAVENOUS

## 2017-12-21 NOTE — Telephone Encounter (Signed)
Patient returning [name]'s call. °

## 2017-12-21 NOTE — Telephone Encounter (Signed)
Spoke with patient and gave results and recommendations. Patient voiced understanding -eh 

## 2017-12-22 ENCOUNTER — Telehealth: Payer: Self-pay | Admitting: Obstetrics and Gynecology

## 2017-12-22 DIAGNOSIS — N939 Abnormal uterine and vaginal bleeding, unspecified: Secondary | ICD-10-CM

## 2017-12-22 DIAGNOSIS — D0501 Lobular carcinoma in situ of right breast: Secondary | ICD-10-CM

## 2017-12-22 DIAGNOSIS — N6310 Unspecified lump in the right breast, unspecified quadrant: Secondary | ICD-10-CM

## 2017-12-22 NOTE — Telephone Encounter (Signed)
Patient returned call to [name]. °

## 2017-12-22 NOTE — Telephone Encounter (Addendum)
Left message to call [name] at [phone].  Need to advise patient of need for right breast biopsies per review of breast MRI with Dr.Jertson via phone call.

## 2017-12-22 NOTE — Telephone Encounter (Signed)
Patient calling for results of breast mri.

## 2017-12-22 NOTE — Telephone Encounter (Signed)
Call to patient. Advised MRI indicates two areas of abnormality of right breast and biopsy indicated.  Patient agreeable and requests medication for sedation as previous biopsy was so uncomfortable. Advised will send to Dr Oscar La for review and driver will be needed. She has not taken before.  Will call patient back with appointment information.

## 2017-12-25 ENCOUNTER — Other Ambulatory Visit: Payer: Self-pay | Admitting: Obstetrics and Gynecology

## 2017-12-25 DIAGNOSIS — N6310 Unspecified lump in the right breast, unspecified quadrant: Secondary | ICD-10-CM

## 2017-12-25 MED ORDER — LORAZEPAM 1 MG PO TABS: 1.0000 mg | ORAL_TABLET | Freq: Three times a day (TID) | ORAL | 0 refills | Status: DC

## 2017-12-25 NOTE — Telephone Encounter (Signed)
Spoke with patient, calling with additional questions.  1. Asking if she should keep [facility] for 5/819? Advised to keep [facility] as schduled .  2. Requesting genetics testing for BRCA gene be done at [facility]? Advised this testing is done through [facility]. Our office can assist with referral. Patient will discuss further with Dr. Talbert Nan at [place].   3. Patient awaiting return call from Dr. Talbert Nan regarding results. Advised Dr. Talbert Nan was in the OR this morning and is now seeing patients, will forward message, return call may not be immediate. Patient is agreeable and verbalizes understanding.  Routing to Dr. Talbert Nan for return call.

## 2017-12-25 NOTE — Telephone Encounter (Signed)
Spoke with Tasha at hospital central scheduling who states they do not perform MRI guided breast biopsies at the hospital.   Spoke with Larita Fife breast navigator at the breast center who states that there are no other locations that perform MRI guided breast biopsies in the area. Scheduling is usually 10 days out. Can call tomorrow to see if there have been any cancellations.  Spoke with patient. Advised of all information received from speak with the hospital and the breast navigator at the [facility]. Patient is agreeable to keep appointment for Friday at 7 am with [facility]. Will call tomorrow to check on any cancellations.  Patient has questions about keeping her aex that is scheduled with Dr.Jertson on 12/27/2017. Also wants to discuss results. Asking for a return call from Dr.Jertson or recommendations on keeping her appointment for aex to discuss.

## 2017-12-25 NOTE — Telephone Encounter (Signed)
I spoke with the patient, discussed her MRI. She is anxious about the procedure, requests ativan. Script for ativan sent to her pharmacy. Referral to genetics placed Will change her annual exam from this Wednesday to the following Wednesday and add an endometrial biopsy. Her LMP was in 10/18 and she just had some spotting this weekend.

## 2017-12-25 NOTE — Telephone Encounter (Signed)
Spoke with [employer]. MRI guided breast biopsies of the right breast scheduled for first available on 12/29/2017 at 7 am at [address] location.   Spoke with patient who states 7 am is too early for her on Friday. Requesting an appointment after 8:45 am or earlier appointment during the week. Advised will return call to [facility] to see if there is a work in slot or later appointment.  Spoke with Cherish at [facility]. No earlier appointments and no later slots on Friday. Can call tomorrow to check on cancellations.   Spoke with patient. Advised could contact hospital imaging to see if this can be performed earlier than Friday if she would be willing to go to another location. Patient is agreeable.  Call to Central Scheduling. Left message for biopsy scheduler Tasha to return call to assist with scheduling.

## 2017-12-26 ENCOUNTER — Telehealth: Payer: Self-pay | Admitting: Obstetrics and Gynecology

## 2017-12-26 NOTE — Telephone Encounter (Signed)
Left message to convey benefits. °

## 2017-12-27 ENCOUNTER — Telehealth: Payer: Self-pay | Admitting: Hematology

## 2017-12-27 ENCOUNTER — Ambulatory Visit: Payer: BLUE CROSS/BLUE SHIELD | Admitting: Obstetrics and Gynecology

## 2017-12-29 ENCOUNTER — Ambulatory Visit
Admission: RE | Admit: 2017-12-29 | Discharge: 2017-12-29 | Disposition: A | Discharge status: Home / Self Care | Payer: BLUE CROSS/BLUE SHIELD | Source: Ambulatory Visit | Attending: Obstetrics and Gynecology | Admitting: Obstetrics and Gynecology

## 2017-12-29 ENCOUNTER — Telehealth: Payer: Self-pay | Admitting: Genetic Counselor

## 2017-12-29 DIAGNOSIS — N6011 Diffuse cystic mastopathy of right breast: Secondary | ICD-10-CM | POA: Diagnosis not present

## 2017-12-29 DIAGNOSIS — N6310 Unspecified lump in the right breast, unspecified quadrant: Secondary | ICD-10-CM

## 2017-12-29 DIAGNOSIS — N6489 Other specified disorders of breast: Secondary | ICD-10-CM | POA: Diagnosis not present

## 2017-12-29 MED ORDER — GADOBENATE DIMEGLUMINE 529 MG/ML IV SOLN
18.0000 mL | Freq: Once | INTRAVENOUS | Status: AC | PRN
  Administered 2017-12-29: 18 mL via INTRAVENOUS

## 2017-12-29 NOTE — Telephone Encounter (Signed)
A genetic counseling appt has been scheduled for the pt to see Ofri on 5/31 at 845am. Pt was referred to our office by Dr. Oscar La. Pt aware to arrive 30 minutes early. Letter mailed.

## 2018-01-03 ENCOUNTER — Other Ambulatory Visit: Payer: Self-pay

## 2018-01-03 ENCOUNTER — Ambulatory Visit: Payer: BLUE CROSS/BLUE SHIELD | Admitting: Obstetrics and Gynecology

## 2018-01-03 ENCOUNTER — Other Ambulatory Visit (HOSPITAL_COMMUNITY)
Admission: RE | Admit: 2018-01-03 | Discharge: 2018-01-03 | Disposition: A | Discharge status: Home / Self Care | Payer: BLUE CROSS/BLUE SHIELD | Source: Ambulatory Visit | Attending: Obstetrics and Gynecology | Admitting: Obstetrics and Gynecology

## 2018-01-03 ENCOUNTER — Encounter: Payer: Self-pay | Admitting: Obstetrics and Gynecology

## 2018-01-03 VITALS — BP 124/70 | HR 88 | Resp 16 | Ht 70.5 in | Wt 204.0 lb

## 2018-01-03 DIAGNOSIS — N951 Menopausal and female climacteric states: Secondary | ICD-10-CM | POA: Diagnosis not present

## 2018-01-03 DIAGNOSIS — Z01419 Encounter for gynecological examination (general) (routine) without abnormal findings: Secondary | ICD-10-CM | POA: Diagnosis not present

## 2018-01-03 DIAGNOSIS — N858 Other specified noninflammatory disorders of uterus: Secondary | ICD-10-CM | POA: Diagnosis not present

## 2018-01-03 DIAGNOSIS — Z124 Encounter for screening for malignant neoplasm of cervix: Secondary | ICD-10-CM

## 2018-01-03 DIAGNOSIS — N939 Abnormal uterine and vaginal bleeding, unspecified: Secondary | ICD-10-CM

## 2018-01-03 DIAGNOSIS — N644 Mastodynia: Secondary | ICD-10-CM | POA: Diagnosis not present

## 2018-01-03 MED ORDER — VALACYCLOVIR HCL 1 G PO TABS: ORAL_TABLET | ORAL | 1 refills | Status: DC

## 2018-01-03 MED ORDER — MEDROXYPROGESTERONE ACETATE 5 MG PO TABS: 5.0000 mg | ORAL_TABLET | Freq: Every day | ORAL | 0 refills | Status: DC

## 2018-01-03 NOTE — Progress Notes (Signed)
52 y.o. [id number] MarriedCaucasianF here for annual exam.   She had spotting a few weeks ago. Prior menses was in 10/18. Lab work from last month showed a premenopausal estradiol level and [place].  Recent breast biopsy was negative for cancer. Breast pain isn't any better, still present from previously and from biopsy. She had a fever last night, just over 100. No fever today. No significant aching today, just thinks she is worn out from over doing.  She c/o getting hot at night every 1-2 night, not sleeping as well. Not having consistent hot flashes. No vaginal dryness.   She gets rare cold sores. Valtrex for prn use.     Patient's last menstrual period was 06/10/2017.          Sexually active: Yes.   The current method of family planning is none.    Exercising: Yes.    walking Smoker:  no  Health Maintenance: Pap:  12-03-14 WNL NEG HR HPV 12-22-11 WNL  History of abnormal Pap:  no MMG:  12-29-17 breast BC PASH follow up DX MMG and U/S in 6 month Colonoscopy:  2018 polyps repeat in 5 yrs  BMD:   Never TDaP:  12-03-14 Gardasil: N/A   reports that she has never smoked. She has never used smokeless tobacco. She reports that she drinks about 0.6 oz of alcohol per week. She reports that she does not use drugs.Son is 63, daughter is 36.  Past Medical History:  Diagnosis Date  . Breast mass, right    carcinoma in situ  . GERD (gastroesophageal reflux disease)   . Hypothyroidism   . Thyroid disease    sees Dr. Chalmers Cater    Past Surgical History:  Procedure Laterality Date  . BREAST LUMPECTOMY WITH RADIOACTIVE SEED LOCALIZATION Right 04/16/2015   Procedure: RIGHT BREAST LUMPECTOMY WITH RADIOACTIVE SEED LOCALIZATION;  Surgeon: Erroll Luna, MD;  Location: [place];  Service: General;  Laterality: Right;  . BREAST SURGERY     age 19--benign breast mass removed  . lipoma removal  12/2009   left groin area--Dr. Trina Ao  . LUMBAR DISC SURGERY  12/2013   --Dr. Sherley Bounds    Current Outpatient Medications  Medication Sig Dispense Refill  . Levothyroxine Sodium 112 MCG CAPS Take 1 tablet by mouth daily.  5  . omeprazole (PRILOSEC) 40 MG capsule Take 40 mg by mouth as needed.     . valACYclovir (VALTREX) 1000 MG tablet Take 2 tablets (2000 mg) by mouth every 12 hours for 24 hours prn cold sores. 30 tablet 1   No current facility-administered medications for this visit.     Family History  Problem Relation Age of Onset  . Diabetes Father        AODM  . Prostate cancer Father   . Cancer Father 39       Dec-cancer unknown  origin, also hx of prostate cancer  . Cancer Paternal Grandmother        Dec--multiple myeloma  . Heart attack Maternal Grandfather     Review of Systems  Constitutional: Negative.   HENT: Negative.   Eyes: Negative.   Respiratory: Negative.   Cardiovascular: Negative.   Gastrointestinal: Negative.   Endocrine: Negative.   Genitourinary:       Right breast pain post BX Hot flashes  Musculoskeletal: Negative.   Skin: Negative.   Allergic/Immunologic: Negative.   Neurological: Negative.   Hematological: Negative.   Psychiatric/Behavioral: Negative.     Exam:   BP  124/70 (BP Location: Right Arm, Patient Position: Sitting, Cuff Size: Normal)   Pulse 88   Resp 16   Ht 5' 10.5" (1.791 m)   Wt 204 lb (92.5 kg)   LMP 06/10/2017   BMI 28.86 kg/m   Weight change: '@WEIGHTCHANGE'$ @ Height:   Height: 5' 10.5" (179.1 cm)  Ht Readings from Last 3 Encounters:  01/03/18 5' 10.5" (1.791 m)  12/07/17 5' 10.25" (1.784 m)  12/22/16 5' 10.25" (1.784 m)    General appearance: alert, cooperative and appears stated age Head: Normocephalic, without obvious abnormality, atraumatic Neck: no adenopathy, supple, symmetrical, trachea midline and thyroid normal to inspection and palpation Lungs: clear to auscultation bilaterally Cardiovascular: regular rate and rhythm Breasts: right breast with extensive bruising, no erythema, no  induration, no signs of infection. Left breast without lumps, tenderness or skin changes Abdomen: soft, non-tender; non distended,  no masses,  no organomegaly Extremities: extremities normal, atraumatic, no cyanosis or edema Skin: Skin color, texture, turgor normal. No rashes or lesions Lymph nodes: Cervical, supraclavicular, and axillary nodes normal. No abnormal inguinal nodes palpated Neurologic: Grossly normal   Pelvic: External genitalia:  no lesions              Urethra:  normal appearing urethra with no masses, tenderness or lesions              Bartholins and Skenes: normal                 Vagina: normal appearing vagina with normal color and discharge, no lesions              Cervix: no lesions               Bimanual Exam:  Uterus:  normal size, contour, position, consistency, mobility, non-tender              Adnexa: no mass, fullness, tenderness               Rectovaginal: Confirms               Anus:  normal sphincter tone, no lesions  The risks of endometrial biopsy were reviewed and a consent was obtained.  A speculum was placed in the vagina and the cervix was cleansed with betadine. A tenaculum was placed on the cervix and the mini-pipelle was placed into the endometrial cavity. The uterus sounded to 8 cm. The endometrial biopsy was performed, minimal tissue was obtained, 2 passes were done (definitely in the cavity). The tenaculum and speculum were removed. There were no complications.    Chaperone was present for exam.  A:  Well Woman with normal exam  Breast pain, just had imaging and a negative biopsy  Abnormal perimenopausal bleeding   Oral HSV, script given for prn use  Vasomotor symptoms, worse at night  H/O lobular carcinoma in situ of the right breast, s/p lumpectomy in 2016   P:   Pap with hpv  Endometrial biopsy  Provera 5 mg x 5 days now  Discussed breast self exam  Discussed calcium and vit D intake  F/U breast imaging in 6 months  Colonoscopy  UTD  Discussed avoiding triggers for vasomotor symptoms  Can try over the counter agents for menopausal symptoms

## 2018-01-03 NOTE — Patient Instructions (Addendum)
To try and decrease your breast pain, you should have a well fitting supportive bra, cut back on caffeine, and use ice or heat as needed. Some women find relief with the supplement evening primrose oil.  Try estroven pm for sleep  Endometrial Biopsy Post-procedure Instructions . Cramping is common.  You may take Ibuprofen, Aleve, or Tylenol for the cramping.  This should resolve within 24 hours.   . You may have a small amount of spotting.  You should wear a mini pad for the next few days. . You may have intercourse in 24 hours. . You need to call the office if you have any pelvic pain, fever, heavy bleeding, or foul smelling vaginal discharge. . Shower or bathe as normal . You will be notified within one week of your biopsy results or we will discuss your results at your follow-up appointment if needed.   Menopause Menopause is the normal time of life when menstrual periods stop completely. Menopause is complete when you have missed 12 consecutive menstrual periods. It usually occurs between the ages of 48 years and 55 years. Very rarely does a woman develop menopause before the age of 40 years. At menopause, your ovaries stop producing the female hormones estrogen and progesterone. This can cause undesirable symptoms and also affect your health. Sometimes the symptoms may occur 4-5 years before the menopause begins. There is no relationship between menopause and:  Oral contraceptives.  Number of children you had.  Race.  The age your menstrual periods started (menarche).  Heavy smokers and very thin women may develop menopause earlier in life. What are the causes?  The ovaries stop producing the female hormones estrogen and progesterone. Other causes include:  Surgery to remove both ovaries.  The ovaries stop functioning for no known reason.  Tumors of the pituitary gland in the brain.  Medical disease that affects the ovaries and hormone production.  Radiation treatment to  the abdomen or pelvis.  Chemotherapy that affects the ovaries.  What are the signs or symptoms?  Hot flashes.  Night sweats.  Decrease in sex drive.  Vaginal dryness and thinning of the vagina causing painful intercourse.  Dryness of the skin and developing wrinkles.  Headaches.  Tiredness.  Irritability.  Memory problems.  Weight gain.  Bladder infections.  Hair growth of the face and chest.  Infertility. More serious symptoms include:  Loss of bone (osteoporosis) causing breaks (fractures).  Depression.  Hardening and narrowing of the arteries (atherosclerosis) causing heart attacks and strokes.  How is this diagnosed?  When the menstrual periods have stopped for 12 straight months.  Physical exam.  Hormone studies of the blood. How is this treated? There are many treatment choices and nearly as many questions about them. The decisions to treat or not to treat menopausal changes is an individual choice made with your health care provider. Your health care provider can discuss the treatments with you. Together, you can decide which treatment will work best for you. Your treatment choices may include:  Hormone therapy (estrogen and progesterone).  Non-hormonal medicines.  Treating the individual symptoms with medicine (for example antidepressants for depression).  Herbal medicines that may help specific symptoms.  Counseling by a psychiatrist or psychologist.  Group therapy.  Lifestyle changes including: ? Eating healthy. ? Regular exercise. ? Limiting caffeine and alcohol. ? Stress management and meditation.  No treatment.  Follow these instructions at home:  Take the medicine your health care provider gives you as directed.  Get plenty  of sleep and rest.  Exercise regularly.  Eat a diet that contains calcium (good for the bones) and soy products (acts like estrogen hormone).  Avoid alcoholic beverages.  Do not smoke.  If you have  hot flashes, dress in layers.  Take supplements, calcium, and vitamin D to strengthen bones.  You can use over-the-counter lubricants or moisturizers for vaginal dryness.  Group therapy is sometimes very helpful.  Acupuncture may be helpful in some cases. Contact a health care provider if:  You are not sure you are in menopause.  You are having menopausal symptoms and need advice and treatment.  You are still having menstrual periods after age 61 years.  You have pain with intercourse.  Menopause is complete (no menstrual period for 12 months) and you develop vaginal bleeding.  You need a referral to a specialist (gynecologist, psychiatrist, or psychologist) for treatment. Get help right away if:  You have severe depression.  You have excessive vaginal bleeding.  You fell and think you have a broken bone.  You have pain when you urinate.  You develop leg or chest pain.  You have a fast pounding heart beat (palpitations).  You have severe headaches.  You develop vision problems.  You feel a lump in your breast.  You have abdominal pain or severe indigestion. This information is not intended to replace advice given to you by your health care provider. Make sure you discuss any questions you have with your health care provider. Document Released: 10/29/2003 Document Revised: 01/14/2016 Document Reviewed: 03/07/2013 Elsevier Interactive Patient Education  2017 [employer] Inc. Menopause and Herbal Products What is menopause? Menopause is the normal time of life when menstrual periods decrease in frequency and eventually stop completely. This process can take several years for some women. Menopause is complete when you have had an absence of menstruation for a full year since your last menstrual period. It usually occurs between the ages of 19 and 16. It is not common for menopause to begin before the age of 33. During menopause, your body stops producing the female hormones  estrogen and progesterone. Common symptoms associated with this loss of hormones (vasomotor symptoms) are:  Hot flashes.  Hot flushes.  Night sweats.  Other common symptoms and complications of menopause include:  Decrease in sex drive.  Vaginal dryness and thinning of the walls of the vagina. This can make sex painful.  Dryness of the skin and development of wrinkles.  Headaches.  Tiredness.  Irritability.  Memory problems.  Weight gain.  Bladder infections.  Hair growth on the face and chest.  Inability to reproduce offspring (infertility).  Loss of density in the bones (osteoporosis) increasing your risk for breaks (fractures).  Depression.  Hardening and narrowing of the arteries (atherosclerosis). This increases your risk of heart attack and stroke.  What treatment options are available? There are many treatment choices for menopause symptoms. The most common treatment is hormone replacement therapy. Many alternative therapies for menopause are emerging, including the use of herbal products. These supplements can be found in the form of herbs, teas, oils, tinctures, and pills. Common herbal supplements for menopause are made from plants that contain phytoestrogens. Phytoestrogens are compounds that occur naturally in plants and plant products. They act like estrogen in the body. Foods and herbs that contain phytoestrogens include:  Soy.  Flax seeds.  Red clover.  Ginseng.  What menopause symptoms may be helped if I use herbal products?  Vasomotor symptoms. These may be helped by: ?  Soy. Some studies show that soy may have a moderate benefit for hot flashes. ? Black cohosh. There is limited evidence indicating this may be beneficial for hot flashes.  Symptoms that are related to heart and blood vessel disease. These may be helped by soy. Studies have shown that soy can help to lower cholesterol.  Depression. This may be helped by: ? St. John's wort.  There is limited evidence that shows this may help mild to moderate depression. ? Black cohosh. There is evidence that this may help depression and mood swings.  Osteoporosis. Soy may help to decrease bone loss that is associated with menopause and may prevent osteoporosis. Limited evidence indicates that red clover may offer some bone loss protection as well. Other herbal products that are commonly used during menopause lack enough evidence to support their use as a replacement for conventional menopause therapies. These products include evening primrose, ginseng, and red clover. What are the cases when herbal products should not be used during menopause? Do not use herbal products during menopause without your health care provider's approval if:  You are taking medicine.  You have a preexisting liver condition.  Are there any risks in my taking herbal products during menopause? If you choose to use herbal products to help with symptoms of menopause, keep in mind that:  Different supplements have different and unmeasured amounts of herbal ingredients.  Herbal products are not regulated the same way that medicines are.  Concentrations of herbs may vary depending on the way they are prepared. For example, the concentration may be different in a pill, tea, oil, and tincture.  Little is known about the risks of using herbal products, particularly the risks of long-term use.  Some herbal supplements can be harmful when combined with certain medicines.  Most commonly reported side effects of herbal products are mild. However, if used improperly, many herbal supplements can cause serious problems. Talk to your health care provider before starting any herbal product. If problems develop, stop taking the supplement and let your health care provider know. This information is not intended to replace advice given to you by your health care provider. Make sure you discuss any questions you have with  your health care provider. Document Released: 01/25/2008 Document Revised: 07/05/2016 Document Reviewed: 01/21/2014 Elsevier Interactive Patient Education  2017 [employer].

## 2018-01-05 ENCOUNTER — Other Ambulatory Visit: Payer: Self-pay | Admitting: Obstetrics and Gynecology

## 2018-01-05 NOTE — Telephone Encounter (Signed)
Medication refill request: provera  Last AEX:  01-03-18 Next AEX: 01-24-19 Last MMG (if hormonal medication request): breast biopsy done. See reports. Refill authorized: This request today is denied due to rx being sent in at her aex 01-03-18

## 2018-01-09 LAB — CYTOLOGY - PAP
Diagnosis: NEGATIVE
HPV: NOT DETECTED

## 2018-01-10 ENCOUNTER — Telehealth: Payer: Self-pay | Admitting: *Deleted

## 2018-01-10 NOTE — Telephone Encounter (Signed)
Voice mail is full- will try back later -eh

## 2018-01-10 NOTE — Telephone Encounter (Signed)
-----   Message from Romualdo Bolk, MD sent at 01/09/2018  7:49 PM EDT ----- 02 recall Please inform the patient that her endometrial biopsy was benign and showed atrophy (c/w her being perimenopausal). She was given a script for provera, she should call with or without bleeding. I would recommend that she take provera 5 mg x 5 days every other month until she goes 6 months without bleeding. Please call in 2 refills on her provera.

## 2018-01-18 MED ORDER — MEDROXYPROGESTERONE ACETATE 5 MG PO TABS: 5.0000 mg | ORAL_TABLET | Freq: Every day | ORAL | 1 refills | Status: DC

## 2018-01-18 NOTE — Telephone Encounter (Signed)
Spoke with patient and gave results and recommendations. RX sent in for Provera. Patient will let us know is she does or does not bleed -eh

## 2018-01-18 NOTE — Telephone Encounter (Signed)
Left message to call back regarding lab results -eh  

## 2018-01-19 ENCOUNTER — Inpatient Hospital Stay: Payer: BLUE CROSS/BLUE SHIELD

## 2018-01-19 ENCOUNTER — Encounter: Payer: Self-pay | Admitting: Genetic Counselor

## 2018-01-19 ENCOUNTER — Inpatient Hospital Stay: Payer: BLUE CROSS/BLUE SHIELD | Attending: Genetic Counselor | Admitting: Genetic Counselor

## 2018-01-19 NOTE — Progress Notes (Signed)
[facility]      Initial Visit   Patient Name: Christine Velez Patient DOB: 1966-06-28 Patient Age: 52 y.o. Encounter Date: 01/19/2018  Referring Provider: Sumner Boast, MD  Primary Care Provider: Salvadore Dom, MD  Reason for Visit: Evaluate for hereditary susceptibility to cancer    Assessment and Plan:  . Ms. Rubin's history is not suggestive of a hereditary predisposition to cancer, but testing is reasonable given her history of AH/LCIS as she may wish to proceed with various risk-reduction strategies.   . Testing is recommended to determine whether she has a pathogenic mutation that will impact her screening and risk-reduction for cancer. A negative result will be reassuring.  . Ms. Janak wished to pursue genetic testing and a blood sample will be sent for analysis of the 83 genes on [facility]'s Multi-Cancer panel (ALK, APC, ATM, AXIN2, BAP1, BARD1, BLM, BMPR1A, BRCA1, BRCA2, BRIP1, CASR, CDC73, CDH1, CDK4, CDKN1B, CDKN1C, CDKN2A, CEBPA, CHEK2, CTNNA1, DICER1, DIS3L2, EGFR, EPCAM, FH, FLCN, GATA2, GPC3, GREM1, HOXB13, HRAS, KIT, MAX, MEN1, MET, MITF, MLH1, MSH2, MSH3, MSH6, MUTYH, NBN, NF1, NF2, NTHL1, PALB2, PDGFRA, PHOX2B, PMS2, POLD1, POLE, POT1, PRKAR1A, PTCH1, PTEN, RAD50, RAD51C, RAD51D, RB1, RECQL4, RET, RUNX1, SDHA, SDHAF2, SDHB, SDHC, SDHD, SMAD4, SMARCA4, SMARCB1, SMARCE1, STK11, SUFU, TERC, TERT, TMEM127, TP53, TSC1, TSC2, VHL, WRN, WT1).  . Results should be available in approximately 2-3 weeks, at which point we will contact her and address implications for her as well as address genetic testing for at-risk family members, if needed.     Dr. Jana Hakim was available for questions concerning this case. Total time spent by me in face-to-face counseling was approximately 30 minutes.   _____________________________________________________________________   History of Present Illness: Ms. Christine Velez, a 52 y.o. female, is being seen at  the [facility] due to a family history of cancer. She presents to clinic today to discuss the possibility of a hereditary predisposition to cancer and discuss whether genetic testing is warranted.  Ms. Hoopingarner has no personal history of cancer. She had a right breast lumpectomy in 03/2015 which showed LCIS and ALH. She has been getting a mammogram with tomosynthesis routinely, but it is unclear whether she has a yearly breast MRI.  She noted that a colonoscopy at age 58 revealed several polyps, but she did not know the type(s). She was asked to return in 5 years.  Past Medical History:  Diagnosis Date  . Breast mass, right    carcinoma in situ  . GERD (gastroesophageal reflux disease)   . Hypothyroidism   . Thyroid disease    sees Dr. Chalmers Cater    Past Surgical History:  Procedure Laterality Date  . BREAST LUMPECTOMY WITH RADIOACTIVE SEED LOCALIZATION Right 04/16/2015   Procedure: RIGHT BREAST LUMPECTOMY WITH RADIOACTIVE SEED LOCALIZATION;  Surgeon: Erroll Luna, MD;  Location: [place];  Service: General;  Laterality: Right;  . BREAST SURGERY     age 89--benign breast mass removed  . lipoma removal  12/2009   left groin area--Dr. Trina Ao  . LUMBAR DISC SURGERY  12/2013   --Dr. Sherley Bounds    Social History   Socioeconomic History  . Marital status: Married    Spouse name: Not on file  . Number of children: Not on file  . Years of education: Not on file  . Highest education level: Not on file  Occupational History  . Not on file  Social Needs  .  Financial resource strain: Not on file  . Food insecurity:    Worry: Not on file    Inability: Not on file  . Transportation needs:    Medical: Not on file    Non-medical: Not on file  Tobacco Use  . Smoking status: Never Smoker  . Smokeless tobacco: Never Used  Substance and Sexual Activity  . Alcohol use: Yes    Alcohol/week: 0.6 oz    Types: 1 Standard drinks or  equivalent per week    Comment: social  . Drug use: No  . Sexual activity: Yes    Partners: Male    Birth control/protection: None  Lifestyle  . Physical activity:    Days per week: Not on file    Minutes per session: Not on file  . Stress: Not on file  Relationships  . Social connections:    Talks on phone: Not on file    Gets together: Not on file    Attends religious service: Not on file    Active member of club or organization: Not on file    Attends meetings of clubs or organizations: Not on file    Relationship status: Not on file  Other Topics Concern  . Not on file  Social History Narrative  . Not on file     Family History:  During the visit, a 4-generation pedigree was obtained. Family tree will be scanned in the Media tab in Epic  Significant diagnoses include the following:  Family History  Problem Relation Age of Onset  . Diabetes Father        AODM  . Prostate cancer Father 33       deceased 65  . Cancer Father 17       cancer unknownorigin  . Multiple myeloma Paternal Grandmother 44       deceased 6s  . Heart attack Maternal Grandfather   . Cancer Paternal Uncle        unk. primary; deceaesd late 63s    Additionally, Ms. Linden has a son (age 29) and a daughter (age 8). She has no siblings. Her mother (age 40) had a paternal half-sister. Her father (noted above) had 2 brothers and a sister.  Ms. Kittleson ancestry is Caucasian - NOS. There is no known Jewish ancestry and no consanguinity.  Discussion: We reviewed the characteristics, features and inheritance patterns of hereditary cancer syndromes. We discussed her risk of harboring a mutation in the context of her personal and family history. We discussed the process of genetic testing, insurance coverage and implications of results: positive, negative and variant of unknown significance (VUS).    Ms. Betsill questions were answered to her satisfaction today and she is welcome to call with any  additional questions or concerns. Thank you for the referral and allowing [place] to share in the care of your patient.    Steele Berg, MS, [place] Certified Genetic Counselor phone: [phone] [email or web address]

## 2018-01-22 ENCOUNTER — Telehealth: Payer: Self-pay | Admitting: Obstetrics and Gynecology

## 2018-01-22 NOTE — Telephone Encounter (Signed)
The patient was told to call if she started bleeding over the weekend after starting medication.

## 2018-01-22 NOTE — Telephone Encounter (Signed)
Spoke with patient. Started provera 5mg  daily on 01/04/18. Reports spotting on 6/1 & 6/2. Reviewed recommendations per Dr. Oscar LaJertson dated 01/09/18. Patient verbalizes understanding and is agreeable.   Advised will update Dr. Reyne DumasJerston, I will return call if any additional recommendations.   Routing to provider for final review. Patient is agreeable to disposition. Will close encounter.

## 2018-01-24 DIAGNOSIS — Z136 Encounter for screening for cardiovascular disorders: Secondary | ICD-10-CM | POA: Diagnosis not present

## 2018-01-24 DIAGNOSIS — Z0000 Encounter for general adult medical examination without abnormal findings: Secondary | ICD-10-CM | POA: Diagnosis not present

## 2018-01-24 DIAGNOSIS — Z131 Encounter for screening for diabetes mellitus: Secondary | ICD-10-CM | POA: Diagnosis not present

## 2018-01-24 DIAGNOSIS — E039 Hypothyroidism, unspecified: Secondary | ICD-10-CM | POA: Diagnosis not present

## 2018-01-26 ENCOUNTER — Encounter: Payer: Self-pay | Admitting: Genetic Counselor

## 2018-01-26 ENCOUNTER — Ambulatory Visit: Payer: Self-pay | Admitting: Genetic Counselor

## 2018-01-26 DIAGNOSIS — Z1379 Encounter for other screening for genetic and chromosomal anomalies: Secondary | ICD-10-CM

## 2018-01-26 HISTORY — DX: Encounter for other screening for genetic and chromosomal anomalies: Z13.79

## 2018-01-26 NOTE — Progress Notes (Signed)
[facility]       Genetic Test Results    Patient Name: Christine Velez Patient DOB: October 18, 1965 Patient Age: 52 y.o. Encounter Date: 01/26/2018  Referring Provider: Sumner Boast, MD  Primary Care Provider: Salvadore Dom, MD   [name] was called today to discuss genetic test results. Please see the Genetics note from her visit on 01/19/2018 for a detailed discussion of her personal and family history.  Genetic Testing: At the time of Christine Velez's visit, she decided to pursue genetic testing of multiple genes associated with hereditary susceptibility to cancer. Testing included sequencing and deletion/duplication analysis. Testing did not reveal a pathogenic mutation in any of the genes analyzed.  A copy of the genetic test report will be scanned into Epic under the Media tab.  The genes analyzed were the 83 genes on [facility]'s Multi-Cancer panel (ALK, APC, ATM, AXIN2, BAP1, BARD1, BLM, BMPR1A, BRCA1, BRCA2, BRIP1, CASR, CDC73, CDH1, CDK4, CDKN1B, CDKN1C, CDKN2A, CEBPA, CHEK2, CTNNA1, DICER1, DIS3L2, EGFR, EPCAM, FH, FLCN, GATA2, GPC3, GREM1, HOXB13, HRAS, KIT, MAX, MEN1, MET, MITF, MLH1, MSH2, MSH3, MSH6, MUTYH, NBN, NF1, NF2, NTHL1, PALB2, PDGFRA, PHOX2B, PMS2, POLD1, POLE, POT1, PRKAR1A, PTCH1, PTEN, RAD50, RAD51C, RAD51D, RB1, RECQL4, RET, RUNX1, SDHA, SDHAF2, SDHB, SDHC, SDHD, SMAD4, SMARCA4, SMARCB1, SMARCE1, STK11, SUFU, TERC, TERT, TMEM127, TP53, TSC1, TSC2, VHL, WRN, WT1).  Since the current test is not perfect, it is possible that there may be a gene mutation that current testing cannot detect, but that chance is small. It is possible that a different genetic factor, which has not yet been discovered or is not on this panel, is responsible for the cancer diagnoses in the family. Again, the likelihood of this is low. No additional testing is recommended at this time for Christine Velez.  A Variant of Uncertain Significance was detected: CEBPA c.724G>A  (p.Gly242Ser). This is still considered a normal result. While at this time, it is unknown if this finding is associated with increased cancer risk, the majority of these variants get reclassified to be inconsequential. Medical management should not be based on this finding. With time, we suspect the lab will determine the significance, if any. If we do learn more about it, we will try to contact Christine Velez to discuss it further. It is important to stay in touch with [place] periodically and keep the address and phone number up to date.  Cancer Screening: These results are reassuring and indicate that Christine Velez does not likely have an increased risk of cancer due to a mutation in one of these genes. She is recommended to undergo cancer screenings appropriate for women with a history of LCIS and ALH.  Family Members: Family members are recommended to speak with their own providers about appropriate cancer screenings.   Any relative who had cancer at a young age or had a particularly rare cancer may also wish to pursue genetic testing. Genetic counselors can be located in other cities, by visiting the website of the [employer] ([employer]ist[employer].se) and Field seismologist for a Dietitian by zip code.   Family members are not recommended to get tested for the above VUS outside of a research protocol as this finding has no implications for their medical management.    Lastly, cancer genetics is a rapidly advancing field and it is possible that new genetic tests will be appropriate for Christine Velez in the future.  Christine Velez is encouraged to remain in contact with Genetics on an annual basis so we can update her personal and family histories, and let her know of advances in cancer genetics that may benefit the family. Christine Velez questions were answered to her satisfaction today, and she knows she is welcome to call anytime with additional questions.     Steele Berg, MS, [place] Certified Genetic  Counselor phone: [phone]

## 2018-01-30 ENCOUNTER — Other Ambulatory Visit: Payer: Self-pay | Admitting: Obstetrics and Gynecology

## 2018-01-30 NOTE — Telephone Encounter (Signed)
Medication refill request: provera 5mg  tab  Last AEX:  01/03/18 JJ Next AEX: 01/24/19 JJ Last MMG (if hormonal medication request): 01/01/18 MRI Right breast  Refill authorized: 01/18/18 #5tabs/1 Refill to [facility]

## 2018-02-27 ENCOUNTER — Telehealth: Payer: Self-pay | Admitting: Obstetrics and Gynecology

## 2018-02-27 NOTE — Telephone Encounter (Signed)
Spoke with patient. Calling with Provera update. No menses June, took provera 7/3, menses started on 7/7.   Patient aware of recommendations. Will continue to calendar bleeding.  Provera 5mg  x5 days q other mo until she goes 6 mo without bleeding. Call with any questions/concerns.   Routing to provider for final review. Patient is agreeable to disposition. Will close encounter.

## 2018-02-27 NOTE — Telephone Encounter (Signed)
Patient was told to call with an update after taking provera.

## 2018-04-06 ENCOUNTER — Ambulatory Visit (INDEPENDENT_AMBULATORY_CARE_PROVIDER_SITE_OTHER): Payer: BLUE CROSS/BLUE SHIELD

## 2018-04-06 ENCOUNTER — Ambulatory Visit: Payer: BLUE CROSS/BLUE SHIELD | Admitting: Podiatry

## 2018-04-06 ENCOUNTER — Encounter: Payer: Self-pay | Admitting: Podiatry

## 2018-04-06 VITALS — BP 102/63 | HR 72 | Resp 16

## 2018-04-06 DIAGNOSIS — S99921A Unspecified injury of right foot, initial encounter: Secondary | ICD-10-CM | POA: Diagnosis not present

## 2018-04-06 DIAGNOSIS — R600 Localized edema: Secondary | ICD-10-CM

## 2018-04-06 NOTE — Progress Notes (Signed)
   Subjective:    Patient ID: Christine Velez, female    DOB: 1966/05/27, 52 y.o.   MRN: [id number]  HPI    Review of Systems  All other systems reviewed and are negative.      Objective:   Physical Exam        Assessment & Plan:

## 2018-04-07 NOTE — Progress Notes (Signed)
Subjective:   Patient ID: Christine RogerSusan W Velez, female   DOB: 52 y.o.   MRN: [id number]   HPI Patient presents stating she fell on her right ankle and its been swollen and she is due to go to [place] on a walking to her and is desperate for reduction of her swelling that occurred.  She does not smoke likes to be active   Review of Systems  All other systems reviewed and are negative.       Objective:  Physical Exam  Constitutional: She appears well-developed and well-nourished.  Cardiovascular: Intact distal pulses.  Pulmonary/Chest: Effort normal.  Musculoskeletal: Normal range of motion.  Neurological: She is alert.  Skin: Skin is warm.  Nursing note and vitals reviewed.   Neurovascular status intact muscle strength is adequate range of motion within normal limits with patient found to have inflammation pain lateral side right ankle extending into the lateral side of the foot with discoloration and fluid buildup.  There is adequate motion muscle strength is adequate and it is moderately painful when pressed with +2 pitting edema     Assessment:  Probability for aggressive sprain of the right ankle with +2 pitting edema     Plan:  Reviewed condition x-rays and today I applied an Unna boot Ace wrap surgical shoe and advised on leaving this on for 4 days and take it off earlier if any issues should occur.  Patient will be seen back and may require bracing or other treatment and x-rays were discussed with her  X-rays indicate no signs of fracture appears to be soft tissue injury

## 2018-05-02 DIAGNOSIS — E039 Hypothyroidism, unspecified: Secondary | ICD-10-CM | POA: Diagnosis not present

## 2018-05-07 DIAGNOSIS — E063 Autoimmune thyroiditis: Secondary | ICD-10-CM | POA: Diagnosis not present

## 2018-05-07 DIAGNOSIS — E039 Hypothyroidism, unspecified: Secondary | ICD-10-CM | POA: Diagnosis not present

## 2018-05-07 DIAGNOSIS — R1310 Dysphagia, unspecified: Secondary | ICD-10-CM | POA: Diagnosis not present

## 2018-05-07 DIAGNOSIS — E669 Obesity, unspecified: Secondary | ICD-10-CM | POA: Diagnosis not present

## 2018-05-07 DIAGNOSIS — Z23 Encounter for immunization: Secondary | ICD-10-CM | POA: Diagnosis not present

## 2018-05-10 DIAGNOSIS — R1310 Dysphagia, unspecified: Secondary | ICD-10-CM | POA: Diagnosis not present

## 2018-05-10 DIAGNOSIS — R140 Abdominal distension (gaseous): Secondary | ICD-10-CM | POA: Diagnosis not present

## 2018-05-10 DIAGNOSIS — K5904 Chronic idiopathic constipation: Secondary | ICD-10-CM | POA: Diagnosis not present

## 2018-05-10 DIAGNOSIS — K219 Gastro-esophageal reflux disease without esophagitis: Secondary | ICD-10-CM | POA: Diagnosis not present

## 2018-05-23 DIAGNOSIS — K219 Gastro-esophageal reflux disease without esophagitis: Secondary | ICD-10-CM | POA: Diagnosis not present

## 2018-05-23 DIAGNOSIS — R1310 Dysphagia, unspecified: Secondary | ICD-10-CM | POA: Diagnosis not present

## 2018-05-23 DIAGNOSIS — K200 Eosinophilic esophagitis: Secondary | ICD-10-CM | POA: Diagnosis not present

## 2018-07-10 ENCOUNTER — Other Ambulatory Visit: Payer: Self-pay | Admitting: Obstetrics and Gynecology

## 2018-07-10 DIAGNOSIS — Z1231 Encounter for screening mammogram for malignant neoplasm of breast: Secondary | ICD-10-CM

## 2018-08-02 DIAGNOSIS — L573 Poikiloderma of Civatte: Secondary | ICD-10-CM | POA: Diagnosis not present

## 2018-08-02 DIAGNOSIS — D2262 Melanocytic nevi of left upper limb, including shoulder: Secondary | ICD-10-CM | POA: Diagnosis not present

## 2018-08-02 DIAGNOSIS — L819 Disorder of pigmentation, unspecified: Secondary | ICD-10-CM | POA: Diagnosis not present

## 2018-08-02 DIAGNOSIS — D2239 Melanocytic nevi of other parts of face: Secondary | ICD-10-CM | POA: Diagnosis not present

## 2018-08-31 ENCOUNTER — Ambulatory Visit: Payer: BLUE CROSS/BLUE SHIELD

## 2018-10-01 ENCOUNTER — Ambulatory Visit
Admission: RE | Admit: 2018-10-01 | Discharge: 2018-10-01 | Disposition: A | Discharge status: Home / Self Care | Payer: BLUE CROSS/BLUE SHIELD | Source: Ambulatory Visit | Attending: Obstetrics and Gynecology | Admitting: Obstetrics and Gynecology

## 2018-10-01 DIAGNOSIS — Z1231 Encounter for screening mammogram for malignant neoplasm of breast: Secondary | ICD-10-CM | POA: Diagnosis not present

## 2018-10-30 DIAGNOSIS — M222X2 Patellofemoral disorders, left knee: Secondary | ICD-10-CM | POA: Diagnosis not present

## 2018-10-30 DIAGNOSIS — M25562 Pain in left knee: Secondary | ICD-10-CM | POA: Diagnosis not present

## 2019-01-22 ENCOUNTER — Other Ambulatory Visit: Payer: Self-pay

## 2019-01-23 NOTE — Progress Notes (Signed)
53 y.o. G8P2002 Married White or Caucasian Not Hispanic or Latino female here for annual exam.   The patient was evaluated with [facility] last year, c/w perimenopausal bleeding. She was given a script for cyclic provera. Last took it is September. Hasn't taken it since then. Tolerable vasomotor symptoms. Not sexually active. Okay with it.     H/O lobular carcinoma in situ of the right breast, s/p lumpectomy in 2016    Genetic testing only + for a variant, not thought to be significant.   She is hypothyroid, stopped taking her synthroid in the last few months, feel great!   Patient's last menstrual period was 05/10/2018 (exact date).          Sexually active: No.  The current method of family planning is abstinence.    Exercising: Yes.    walking, water aerobics Smoker:  no  Health Maintenance: Pap:  01/03/2018 WNL NEG HPV, 12-03-14 WNL NEG HR HPV 12-22-11 WNL  History of abnormal Pap:  no MMG: 10/01/2018 Birads 1 negative Colonoscopy:  2018 polyps repeat in 5 yrs  BMD:   Never TDaP:  12-03-14 Gardasil: N/A   reports that she has never smoked. She has never used smokeless tobacco. She reports current alcohol use of about 1.0 standard drinks of alcohol per week. She reports that she does not use drugs. Daughter just graduated from [employer], she will go to college in [place]. Son will be a Equities trader in [place].   Past Medical History:  Diagnosis Date  . Breast mass, right    carcinoma in situ  . Genetic testing 01/26/2018   Multi-Cancer panel (83 genes) @ [facility] - No pathogenic mutations detected  . GERD (gastroesophageal reflux disease)   . Hypothyroidism   . Thyroid disease    sees Dr. Chalmers Cater    Past Surgical History:  Procedure Laterality Date  . BREAST EXCISIONAL BIOPSY Right   . BREAST LUMPECTOMY WITH RADIOACTIVE SEED LOCALIZATION Right 04/16/2015   Procedure: RIGHT BREAST LUMPECTOMY WITH RADIOACTIVE SEED LOCALIZATION;  Surgeon: Erroll Luna, MD;  Location: [facility];  Service:  General;  Laterality: Right;  . BREAST SURGERY     age 16--benign breast mass removed  . lipoma removal  12/2009   left groin area--Dr. Trina Ao  . LUMBAR DISC SURGERY  12/2013   --Dr. Sherley Bounds    Current Outpatient Medications  Medication Sig Dispense Refill  . valACYclovir (VALTREX) 1000 MG tablet Take 2 tablets (2000 mg) by mouth every 12 hours x 2 doses prn cold sores. 30 tablet 1   No current facility-administered medications for this visit.     Family History  Problem Relation Age of Onset  . Diabetes Father        AODM  . Prostate cancer Father 61       deceased 40  . Cancer Father 63       cancer unknownorigin  . Multiple myeloma Paternal Grandmother 53       deceased 66s  . Heart attack Maternal Grandfather   . Cancer Paternal Uncle        unk. primary; deceaesd late 70s    Review of Systems  Constitutional: Negative.   HENT: Negative.   Eyes: Negative.   Respiratory: Negative.   Cardiovascular: Negative.   Gastrointestinal: Negative.   Endocrine: Negative.   Genitourinary: Negative.   Musculoskeletal: Negative.   Skin: Negative.   Allergic/Immunologic: Negative.   Neurological: Negative.   Psychiatric/Behavioral: Negative.     Exam:  BP 110/80   Temp (!) 97.5 F (36.4 C) (Skin)   Resp 16   Ht 5' 9.75" (1.772 m)   Wt 180 lb (81.6 kg)   LMP 05/10/2018 (Exact Date)   BMI 26.01 kg/m   Weight change: '@WEIGHTCHANGE'$ @ Height:   Height: 5' 9.75" (177.2 cm)  Ht Readings from Last 3 Encounters:  01/24/19 5' 9.75" (1.772 m)  01/03/18 5' 10.5" (1.791 m)  12/07/17 5' 10.25" (1.784 m)    General appearance: alert, cooperative and appears stated age Head: Normocephalic, without obvious abnormality, atraumatic Neck: no adenopathy, supple, symmetrical, trachea midline and thyroid normal to inspection and palpation Lungs: clear to auscultation bilaterally Cardiovascular: regular rate and rhythm Breasts: normal appearance, no masses or  tenderness Abdomen: soft, non-tender; non distended,  no masses,  no organomegaly Extremities: extremities normal, atraumatic, no cyanosis or edema Skin: Skin color, texture, turgor normal. No rashes or lesions Lymph nodes: Cervical, supraclavicular, and axillary nodes normal. No abnormal inguinal nodes palpated Neurologic: Grossly normal   Pelvic: External genitalia:  no lesions              Urethra:  normal appearing urethra with no masses, tenderness or lesions              Bartholins and Skenes: normal                 Vagina: normal appearing vagina with normal color and discharge, no lesions              Cervix: no lesions               Bimanual Exam:  Uterus:  normal size, contour, position, consistency, mobility, non-tender and retroverted              Adnexa: no mass, fullness, tenderness               Rectovaginal: Confirms               Anus:  normal sphincter tone, no lesions  Chaperone was present for exam.  A:  Well Woman with normal exam  Hypothyroid, off her synthroid   H/O breast cancer, genetic testing only + for variant (not thought to be significant)  P:   No pap this year  Screening labs, TSH  Mammogram and colonoscopy are UTD  Discussed breast self exam  Discussed calcium and vit D intake  Call with further bleeding

## 2019-01-24 ENCOUNTER — Other Ambulatory Visit: Payer: Self-pay

## 2019-01-24 ENCOUNTER — Encounter: Payer: Self-pay | Admitting: Obstetrics and Gynecology

## 2019-01-24 ENCOUNTER — Ambulatory Visit: Payer: BC Managed Care – PPO | Admitting: Obstetrics and Gynecology

## 2019-01-24 VITALS — BP 110/80 | Temp 97.5°F | Resp 16 | Ht 69.75 in | Wt 180.0 lb

## 2019-01-24 DIAGNOSIS — Z01419 Encounter for gynecological examination (general) (routine) without abnormal findings: Secondary | ICD-10-CM

## 2019-01-24 DIAGNOSIS — E038 Other specified hypothyroidism: Secondary | ICD-10-CM

## 2019-01-24 DIAGNOSIS — Z0000 Encounter for general adult medical examination without abnormal findings: Secondary | ICD-10-CM | POA: Diagnosis not present

## 2019-01-24 NOTE — Patient Instructions (Signed)

## 2019-01-25 LAB — COMPREHENSIVE METABOLIC PANEL
ALT: 13 IU/L (ref 0–32)
AST: 19 IU/L (ref 0–40)
Albumin/Globulin Ratio: 2.4 — ABNORMAL HIGH (ref 1.2–2.2)
Albumin: 4.7 g/dL (ref 3.8–4.9)
Alkaline Phosphatase: 56 IU/L (ref 39–117)
BUN/Creatinine Ratio: 22 (ref 9–23)
BUN: 20 mg/dL (ref 6–24)
Bilirubin Total: 0.3 mg/dL (ref 0.0–1.2)
CO2: 20 mmol/L (ref 20–29)
Calcium: 9.2 mg/dL (ref 8.7–10.2)
Chloride: 102 mmol/L (ref 96–106)
Creatinine, Ser: 0.89 mg/dL (ref 0.57–1.00)
GFR calc Af Amer: 86 mL/min/{1.73_m2} (ref >59)
GFR calc non Af Amer: 75 mL/min/{1.73_m2} (ref >59)
Globulin, Total: 2 g/dL (ref 1.5–4.5)
Glucose: 82 mg/dL (ref 65–99)
Potassium: 4.6 mmol/L (ref 3.5–5.2)
Sodium: 140 mmol/L (ref 134–144)
Total Protein: 6.7 g/dL (ref 6.0–8.5)

## 2019-01-25 LAB — LIPID PANEL
Chol/HDL Ratio: 2.5 ratio (ref 0.0–4.4)
Cholesterol, Total: 191 mg/dL (ref 100–199)
HDL: 77 mg/dL (ref >39)
LDL Calculated: 91 mg/dL (ref 0–99)
Triglycerides: 115 mg/dL (ref 0–149)
VLDL Cholesterol Cal: 23 mg/dL (ref 5–40)

## 2019-01-25 LAB — CBC
Hematocrit: 42 % (ref 34.0–46.6)
Hemoglobin: 14.2 g/dL (ref 11.1–15.9)
MCH: 30.9 pg (ref 26.6–33.0)
MCHC: 33.8 g/dL (ref 31.5–35.7)
MCV: 92 fL (ref 79–97)
Platelets: 233 10*3/uL (ref 150–450)
RBC: 4.59 x10E6/uL (ref 3.77–5.28)
RDW: 12.7 % (ref 11.7–15.4)
WBC: 8.3 10*3/uL (ref 3.4–10.8)

## 2019-01-25 LAB — TSH: TSH: 57.6 u[IU]/mL — ABNORMAL HIGH (ref 0.450–4.500)

## 2019-01-28 ENCOUNTER — Telehealth: Payer: Self-pay

## 2019-01-28 MED ORDER — LEVOTHYROXINE SODIUM 112 MCG PO TABS: 112.0000 ug | ORAL_TABLET | Freq: Every day | ORAL | 1 refills | Status: DC

## 2019-01-28 NOTE — Telephone Encounter (Signed)
Spoke with patient. Results given. Patient states that she was stable while taking Synthroid 122 mcg daily dose. Rx for Synthroid 122 mcg daily #30 1RF sent to pharmacy on file. Patient verbalizes understanding. 1 month lab recheck scheduled for 02/20/2019 at 10:30 am. Patient is agreeable to date and time. Encounter closed.

## 2019-01-28 NOTE — Telephone Encounter (Signed)
-----   Message from Salvadore Dom, MD sent at 01/28/2019  4:14 PM EDT ----- Please call the patient and let her know that her TSH is very high. She stopped taking her synthroid a few months ago. Confirm that she was taking 112 mcg prior to stopping and was stable on that dose. If so, please call in 112 mcg of synthroid, #30 with one refill. She should have a repeat TSH in 4 weeks to confirm that this is the correct dose.  The rest of her blood work is normal.

## 2019-02-20 ENCOUNTER — Other Ambulatory Visit (INDEPENDENT_AMBULATORY_CARE_PROVIDER_SITE_OTHER): Payer: BC Managed Care – PPO

## 2019-02-20 ENCOUNTER — Other Ambulatory Visit: Payer: Self-pay

## 2019-02-20 ENCOUNTER — Other Ambulatory Visit: Payer: Self-pay | Admitting: *Deleted

## 2019-02-20 DIAGNOSIS — E038 Other specified hypothyroidism: Secondary | ICD-10-CM

## 2019-02-26 ENCOUNTER — Other Ambulatory Visit: Payer: Self-pay | Admitting: Obstetrics and Gynecology

## 2019-02-26 DIAGNOSIS — E038 Other specified hypothyroidism: Secondary | ICD-10-CM

## 2019-02-26 LAB — TSH: TSH: 8.03 u[IU]/mL — ABNORMAL HIGH (ref 0.450–4.500)

## 2019-02-26 MED ORDER — LEVOTHYROXINE SODIUM 125 MCG PO TABS: 125.0000 ug | ORAL_TABLET | Freq: Every day | ORAL | 1 refills | Status: DC

## 2019-03-05 DIAGNOSIS — Z03818 Encounter for observation for suspected exposure to other biological agents ruled out: Secondary | ICD-10-CM | POA: Diagnosis not present

## 2019-03-27 ENCOUNTER — Other Ambulatory Visit (INDEPENDENT_AMBULATORY_CARE_PROVIDER_SITE_OTHER): Payer: BC Managed Care – PPO

## 2019-03-27 ENCOUNTER — Other Ambulatory Visit: Payer: Self-pay

## 2019-03-27 DIAGNOSIS — E038 Other specified hypothyroidism: Secondary | ICD-10-CM

## 2019-03-28 ENCOUNTER — Other Ambulatory Visit: Payer: BC Managed Care – PPO

## 2019-03-28 ENCOUNTER — Other Ambulatory Visit: Payer: Self-pay | Admitting: Obstetrics and Gynecology

## 2019-03-28 DIAGNOSIS — E039 Hypothyroidism, unspecified: Secondary | ICD-10-CM

## 2019-03-28 LAB — TSH: TSH: 1.4 u[IU]/mL (ref 0.450–4.500)

## 2019-03-28 MED ORDER — LEVOTHYROXINE SODIUM 125 MCG PO TABS: 125.0000 ug | ORAL_TABLET | Freq: Every day | ORAL | 0 refills | Status: DC

## 2019-06-03 DIAGNOSIS — E669 Obesity, unspecified: Secondary | ICD-10-CM | POA: Diagnosis not present

## 2019-06-03 DIAGNOSIS — E063 Autoimmune thyroiditis: Secondary | ICD-10-CM | POA: Diagnosis not present

## 2019-06-03 DIAGNOSIS — Z23 Encounter for immunization: Secondary | ICD-10-CM | POA: Diagnosis not present

## 2019-06-03 DIAGNOSIS — E039 Hypothyroidism, unspecified: Secondary | ICD-10-CM | POA: Diagnosis not present

## 2019-07-05 ENCOUNTER — Other Ambulatory Visit: Payer: Self-pay

## 2019-07-05 NOTE — Telephone Encounter (Signed)
Medication refill request: Levothyroxine  Last AEX:  01/24/19 Next AEX: 01/30/20 Last MMG (if hormonal medication request): 10/01/18  Refill authorized: #90 with 1 rf

## 2019-07-08 NOTE — Telephone Encounter (Signed)
She should have a f/u TSH to make sure her level is stable. Please call in enough synthroid to get her in for a lab test. If she has enough lets wait to get her result.

## 2019-07-09 NOTE — Telephone Encounter (Signed)
Spoke with patient. Advised per Dr. Talbert Nan. Patient states she has had her TSH drawn with Dr. Chalmers Cater recently. States her levels are good and no med changes needed, Dr. Talbert Nan to continue to manage medication. Requested patient request copy of updated labs be forwarded to Dr. Talbert Nan to update chart, patient declined, requested RN request records. Advised patient I will call to request, advised she may need to complete request for records, will return call if needed. No RX sent.   Call placed to [facility]/Dr. Chalmers Cater, left message with medical records requesting copy of recent labs be faxed to Dr. Talbert Nan at [phone].   Routing to Dr. Rosann Auerbach.

## 2019-07-10 MED ORDER — LEVOTHYROXINE SODIUM 125 MCG PO TABS: 125.0000 ug | ORAL_TABLET | Freq: Every day | ORAL | 3 refills | Status: AC

## 2019-07-10 NOTE — Telephone Encounter (Signed)
Spoke with patient. Advised of refill.   Encounter closed.

## 2019-07-10 NOTE — Telephone Encounter (Signed)
Results received and placed on Dr. Gentry Fitz desk for review.

## 2019-07-16 ENCOUNTER — Other Ambulatory Visit: Payer: Self-pay | Admitting: Obstetrics and Gynecology

## 2019-07-25 DIAGNOSIS — Z119 Encounter for screening for infectious and parasitic diseases, unspecified: Secondary | ICD-10-CM | POA: Diagnosis not present

## 2019-08-17 DIAGNOSIS — R05 Cough: Secondary | ICD-10-CM | POA: Diagnosis not present

## 2019-08-17 DIAGNOSIS — R519 Headache, unspecified: Secondary | ICD-10-CM | POA: Diagnosis not present

## 2019-08-17 DIAGNOSIS — R52 Pain, unspecified: Secondary | ICD-10-CM | POA: Diagnosis not present

## 2019-08-17 DIAGNOSIS — U071 COVID-19: Secondary | ICD-10-CM | POA: Diagnosis not present

## 2019-10-22 DIAGNOSIS — D224 Melanocytic nevi of scalp and neck: Secondary | ICD-10-CM | POA: Diagnosis not present

## 2019-10-22 DIAGNOSIS — D225 Melanocytic nevi of trunk: Secondary | ICD-10-CM | POA: Diagnosis not present

## 2019-10-22 DIAGNOSIS — D2262 Melanocytic nevi of left upper limb, including shoulder: Secondary | ICD-10-CM | POA: Diagnosis not present

## 2019-10-22 DIAGNOSIS — D2239 Melanocytic nevi of other parts of face: Secondary | ICD-10-CM | POA: Diagnosis not present

## 2019-12-19 ENCOUNTER — Other Ambulatory Visit: Payer: Self-pay | Admitting: Obstetrics and Gynecology

## 2019-12-19 DIAGNOSIS — Z1231 Encounter for screening mammogram for malignant neoplasm of breast: Secondary | ICD-10-CM

## 2019-12-20 DIAGNOSIS — M25562 Pain in left knee: Secondary | ICD-10-CM | POA: Diagnosis not present

## 2019-12-24 DIAGNOSIS — M5416 Radiculopathy, lumbar region: Secondary | ICD-10-CM | POA: Diagnosis not present

## 2019-12-30 ENCOUNTER — Ambulatory Visit
Admission: RE | Admit: 2019-12-30 | Discharge: 2019-12-30 | Disposition: A | Discharge status: Home / Self Care | Payer: Self-pay | Source: Ambulatory Visit | Attending: Obstetrics and Gynecology | Admitting: Obstetrics and Gynecology

## 2019-12-30 ENCOUNTER — Other Ambulatory Visit: Payer: Self-pay

## 2019-12-30 DIAGNOSIS — Z1231 Encounter for screening mammogram for malignant neoplasm of breast: Secondary | ICD-10-CM

## 2019-12-31 ENCOUNTER — Other Ambulatory Visit: Payer: Self-pay | Admitting: Obstetrics and Gynecology

## 2019-12-31 DIAGNOSIS — R928 Other abnormal and inconclusive findings on diagnostic imaging of breast: Secondary | ICD-10-CM

## 2020-01-01 ENCOUNTER — Other Ambulatory Visit: Payer: Self-pay | Admitting: Obstetrics and Gynecology

## 2020-01-01 MED ORDER — VALACYCLOVIR HCL 1 G PO TABS: ORAL_TABLET | ORAL | 1 refills | Status: DC

## 2020-01-01 NOTE — Telephone Encounter (Addendum)
Med refill request: Valtrex 1000 mg tab Last AEX: 01/24/19 Next AEX: 01/30/20 Hx oral HSV Last Rx sent 01/03/18, #30/1 RF   Rx pended.   Dr. Oscar La -please review and advise on RX.

## 2020-01-01 NOTE — Telephone Encounter (Signed)
Patient has a fever blister on her lip and is asking for a prescription if possible. [facility], [address]

## 2020-01-01 NOTE — Telephone Encounter (Signed)
Patient notified of Rx.  

## 2020-01-02 DIAGNOSIS — M25562 Pain in left knee: Secondary | ICD-10-CM | POA: Diagnosis not present

## 2020-01-07 ENCOUNTER — Ambulatory Visit: Payer: Self-pay

## 2020-01-07 ENCOUNTER — Other Ambulatory Visit: Payer: Self-pay

## 2020-01-07 ENCOUNTER — Ambulatory Visit
Admission: RE | Admit: 2020-01-07 | Discharge: 2020-01-07 | Disposition: A | Discharge status: Home / Self Care | Payer: BC Managed Care – PPO | Source: Ambulatory Visit | Attending: Obstetrics and Gynecology | Admitting: Obstetrics and Gynecology

## 2020-01-07 DIAGNOSIS — R928 Other abnormal and inconclusive findings on diagnostic imaging of breast: Secondary | ICD-10-CM

## 2020-01-07 DIAGNOSIS — M545 Low back pain: Secondary | ICD-10-CM | POA: Diagnosis not present

## 2020-01-07 DIAGNOSIS — R922 Inconclusive mammogram: Secondary | ICD-10-CM | POA: Diagnosis not present

## 2020-01-14 DIAGNOSIS — M25562 Pain in left knee: Secondary | ICD-10-CM | POA: Diagnosis not present

## 2020-01-16 DIAGNOSIS — M545 Low back pain: Secondary | ICD-10-CM | POA: Diagnosis not present

## 2020-01-16 DIAGNOSIS — Z6828 Body mass index (BMI) 28.0-28.9, adult: Secondary | ICD-10-CM | POA: Diagnosis not present

## 2020-01-27 DIAGNOSIS — M25562 Pain in left knee: Secondary | ICD-10-CM | POA: Diagnosis not present

## 2020-01-29 NOTE — Progress Notes (Signed)
54 y.o. G76P2002 Married White or Caucasian Not Hispanic or Latino female here for annual exam. Patient states that she is having pain is her right buttock. She has been doing PT for arthritis in her left knee.   No cycle since November. She is having tolerable hot flashes, mild night sweats intermittently. No dyspareunia.  Some urgency to void, ~1 x a month she has minimal urge incontinence.  Period Duration (Days): 4-5 Period Pattern: (!) Irregular Menstrual Flow: Moderate Menstrual Control: Tampon, Thin pad, Panty liner Menstrual Control Change Freq (Hours): 3 Dysmenorrhea: None   She had covid, recovered well.  H/O lobular carcinoma in situ of the right breast, s/p lumpectomy in 2016    Genetic testing only + for a variant, not thought to be significant.   Patient's last menstrual period was 06/29/2019.          Sexually active: Yes.    The current method of family planning is none  Exercising: Yes.    PT, Swimming, walking.  Smoker:  no  Health Maintenance: Pap:  01/03/2018 WNL NEG HPV, 12-03-14 WNL NEG HR HPV 12-22-11 WNL History of abnormal Pap:  no MMG:  12/30/19 density C additional imaging needing 01/07/20 Bilateral mammo with Tomo and Cad Bi-rads 1 neg  BMD:   never Colonoscopy: 2018 polyps repeat in 5 yrs TDaP:  12-03-14 Gardasil: N/A   reports that she has never smoked. She has never used smokeless tobacco. She reports current alcohol use of about 1.0 standard drink of alcohol per week. 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 Daughter finished her first year in college in [place].   Past Medical History:  Diagnosis Date  . Breast mass, right    carcinoma in situ  . Genetic testing 01/26/2018   Multi-Cancer panel (83 genes) @ [facility] - No pathogenic mutations detected  . GERD (gastroesophageal reflux disease)   . Hypothyroidism   . Thyroid disease    sees Dr. Chalmers Cater    Past Surgical History:  Procedure Laterality Date  .  BREAST EXCISIONAL BIOPSY Right   . BREAST LUMPECTOMY WITH RADIOACTIVE SEED LOCALIZATION Right 04/16/2015   Procedure: RIGHT BREAST LUMPECTOMY WITH RADIOACTIVE SEED LOCALIZATION;  Surgeon: Erroll Luna, MD;  Location: [facility];  Service: General;  Laterality: Right;  . BREAST SURGERY     age 62--benign breast mass removed  . lipoma removal  12/2009   left groin area--Dr. Trina Ao  . LUMBAR DISC SURGERY  12/2013   --Dr. Sherley Bounds    Current Outpatient Medications  Medication Sig Dispense Refill  . levothyroxine (SYNTHROID) 125 MCG tablet Take 1 tablet (125 mcg total) by mouth daily. One po qd 90 tablet 3  . valACYclovir (VALTREX) 1000 MG tablet Take 2 tablets (2000 mg) by mouth every 12 hours x 2 doses prn cold sores. 30 tablet 1   No current facility-administered medications for this visit.    Family History  Problem Relation Age of Onset  . Diabetes Father        AODM  . Prostate cancer Father 61       deceased 59  . Cancer Father 64       cancer unknownorigin  . Multiple myeloma Paternal Grandmother 6       deceased 67s  . Heart attack Maternal Grandfather   . Cancer Paternal Uncle        unk. primary; deceaesd late 70s    Review of Systems  HENT: Negative.   Eyes: Negative.   Respiratory: Negative.   Cardiovascular: Negative.   Gastrointestinal: Negative.   Endocrine: Negative.   Genitourinary: Negative.   Musculoskeletal: Negative.   Skin: Negative.   Allergic/Immunologic: Negative.   Neurological: Negative.   Hematological: Negative.   Psychiatric/Behavioral: Negative.   She has pain left buttock, was advised to exercise.  In PT for knee pain.   Exam:   BP 108/62   Pulse 80   Temp 98.7 F (37.1 C)   Ht 5' 10.5" (1.791 m)   Wt 203 lb (92.1 kg)   LMP 06/29/2019   SpO2 98%   BMI 28.72 kg/m   Weight change: '@WEIGHTCHANGE'$ @ Height:   Height: 5' 10.5" (179.1 cm)  Ht Readings from Last 3 Encounters:  01/30/20 5' 10.5"  (1.791 m)  01/24/19 5' 9.75" (1.772 m)  01/03/18 5' 10.5" (1.791 m)    General appearance: alert, cooperative and appears stated age Head: Normocephalic, without obvious abnormality, atraumatic Neck: no adenopathy, supple, symmetrical, trachea midline and thyroid normal to inspection and palpation Lungs: clear to auscultation bilaterally Cardiovascular: regular rate and rhythm Breasts: normal appearance, no masses or tenderness Abdomen: soft, non-tender; non distended,  no masses,  no organomegaly Extremities: extremities normal, atraumatic, no cyanosis or edema Skin: Skin color, texture, turgor normal. No rashes or lesions Lymph nodes: Cervical, supraclavicular, and axillary nodes normal. No abnormal inguinal nodes palpated Neurologic: Grossly normal   Pelvic: External genitalia:  no lesions              Urethra:  normal appearing urethra with no masses, tenderness or lesions              Bartholins and Skenes: normal                 Vagina: atrophic appearing vagina with normal color and discharge, no lesions              Cervix: no lesions               Bimanual Exam:  Uterus:  normal size, contour, position, consistency, mobility, non-tender              Adnexa: no mass, fullness, tenderness               Rectovaginal: Confirms               Anus:  normal sphincter tone, no lesions  Gae Dry chaperoned for the exam.  A:  Well Woman with normal exam  Hypothyroidism  FH AODM  P:   No pap this year  Labs today  Mammogram and colonoscopy UTD  Call with any bleeding  Thyroid followed by Dr Chalmers Cater  Discussed breast self exam  Discussed calcium and vit D intake

## 2020-01-30 ENCOUNTER — Encounter: Payer: Self-pay | Admitting: Obstetrics and Gynecology

## 2020-01-30 ENCOUNTER — Other Ambulatory Visit: Payer: Self-pay

## 2020-01-30 ENCOUNTER — Ambulatory Visit: Payer: BC Managed Care – PPO | Admitting: Obstetrics and Gynecology

## 2020-01-30 VITALS — BP 108/62 | HR 80 | Temp 98.7°F | Ht 70.5 in | Wt 203.0 lb

## 2020-01-30 DIAGNOSIS — Z833 Family history of diabetes mellitus: Secondary | ICD-10-CM

## 2020-01-30 DIAGNOSIS — Z0000 Encounter for general adult medical examination without abnormal findings: Secondary | ICD-10-CM

## 2020-01-30 DIAGNOSIS — E039 Hypothyroidism, unspecified: Secondary | ICD-10-CM | POA: Diagnosis not present

## 2020-01-30 DIAGNOSIS — Z01419 Encounter for gynecological examination (general) (routine) without abnormal findings: Secondary | ICD-10-CM | POA: Diagnosis not present

## 2020-01-30 DIAGNOSIS — Z853 Personal history of malignant neoplasm of breast: Secondary | ICD-10-CM

## 2020-01-30 NOTE — Patient Instructions (Signed)

## 2020-01-31 ENCOUNTER — Other Ambulatory Visit: Payer: Self-pay | Admitting: Obstetrics and Gynecology

## 2020-01-31 DIAGNOSIS — R7989 Other specified abnormal findings of blood chemistry: Secondary | ICD-10-CM

## 2020-01-31 LAB — COMPREHENSIVE METABOLIC PANEL
ALT: 35 IU/L — ABNORMAL HIGH (ref 0–32)
AST: 18 IU/L (ref 0–40)
Albumin/Globulin Ratio: 2.4 — ABNORMAL HIGH (ref 1.2–2.2)
Albumin: 4.1 g/dL (ref 3.8–4.9)
Alkaline Phosphatase: 80 IU/L (ref 48–121)
BUN/Creatinine Ratio: 28 — ABNORMAL HIGH (ref 9–23)
BUN: 22 mg/dL (ref 6–24)
Bilirubin Total: 0.4 mg/dL (ref 0.0–1.2)
CO2: 26 mmol/L (ref 20–29)
Calcium: 9.4 mg/dL (ref 8.7–10.2)
Chloride: 104 mmol/L (ref 96–106)
Creatinine, Ser: 0.79 mg/dL (ref 0.57–1.00)
GFR calc Af Amer: 99 mL/min/{1.73_m2} (ref >59)
GFR calc non Af Amer: 86 mL/min/{1.73_m2} (ref >59)
Globulin, Total: 1.7 g/dL (ref 1.5–4.5)
Glucose: 89 mg/dL (ref 65–99)
Potassium: 4.3 mmol/L (ref 3.5–5.2)
Sodium: 141 mmol/L (ref 134–144)
Total Protein: 5.8 g/dL — ABNORMAL LOW (ref 6.0–8.5)

## 2020-01-31 LAB — CBC
Hematocrit: 41.4 % (ref 34.0–46.6)
Hemoglobin: 14.2 g/dL (ref 11.1–15.9)
MCH: 31.1 pg (ref 26.6–33.0)
MCHC: 34.3 g/dL (ref 31.5–35.7)
MCV: 91 fL (ref 79–97)
Platelets: 232 10*3/uL (ref 150–450)
RBC: 4.56 x10E6/uL (ref 3.77–5.28)
RDW: 12.8 % (ref 11.7–15.4)
WBC: 7.1 10*3/uL (ref 3.4–10.8)

## 2020-01-31 LAB — HEMOGLOBIN A1C
Est. average glucose Bld gHb Est-mCnc: 111 mg/dL
Hgb A1c MFr Bld: 5.5 % (ref 4.8–5.6)

## 2020-01-31 LAB — LIPID PANEL
Chol/HDL Ratio: 2.6 ratio (ref 0.0–4.4)
Cholesterol, Total: 168 mg/dL (ref 100–199)
HDL: 65 mg/dL (ref >39)
LDL Chol Calc (NIH): 88 mg/dL (ref 0–99)
Triglycerides: 83 mg/dL (ref 0–149)
VLDL Cholesterol Cal: 15 mg/dL (ref 5–40)

## 2020-02-26 ENCOUNTER — Other Ambulatory Visit: Payer: Self-pay

## 2020-02-26 ENCOUNTER — Other Ambulatory Visit (INDEPENDENT_AMBULATORY_CARE_PROVIDER_SITE_OTHER): Payer: BC Managed Care – PPO

## 2020-02-26 DIAGNOSIS — R7989 Other specified abnormal findings of blood chemistry: Secondary | ICD-10-CM | POA: Diagnosis not present

## 2020-02-27 ENCOUNTER — Telehealth: Payer: Self-pay | Admitting: Obstetrics and Gynecology

## 2020-02-27 LAB — HEPATIC FUNCTION PANEL
ALT: 45 IU/L — ABNORMAL HIGH (ref 0–32)
AST: 30 IU/L (ref 0–40)
Albumin: 4.1 g/dL (ref 3.8–4.9)
Alkaline Phosphatase: 75 IU/L (ref 48–121)
Bilirubin Total: 0.4 mg/dL (ref 0.0–1.2)
Bilirubin, Direct: 0.14 mg/dL (ref 0.00–0.40)
Total Protein: 6 g/dL (ref 6.0–8.5)

## 2020-02-27 NOTE — Telephone Encounter (Signed)
Hi Eleny, Your liver function test is still mildly elevated. I think you need to get into a primary care provider for further evaluation. Once you have an appointment, let my office know and we can forward your labs. If you need help establishing care we can help with that as well. Please call with any questions. Gertie Exon  Written by Romualdo Bolk, MD on 02/27/2020 12:26 PM EDT Seen by patient Townsend Roger on 02/27/2020 12:56 PM   Spoke with patient. Patient is very concerned about results. Patient does not have a PCP. Patient is asking if she could see Dr.Mann for this as she is due to a colonoscopy as well and knows Dr.Mann can evaluate the liver as well? Or if she should see a Hepatologist or if Dr.Jertson really wants her to see a PCP? Advised will call Dr.Mann's office regarding evaluation to see if this is something she could also see her for and will discuss with Dr.Jertson and return call.  Call to Dr.Mann's office which is closed at this time. Will have to try again tomorrow.

## 2020-02-27 NOTE — Telephone Encounter (Signed)
Patient got her lab results in her my chart. Patient wants to speak with the nurse.

## 2020-02-28 NOTE — Telephone Encounter (Signed)
Dr Loreta Ave would be a great person to evaluate her liver. I will send a mychart message.  Please forward a copy of all of her labs to Dr Loreta Ave.

## 2020-03-02 NOTE — Telephone Encounter (Signed)
Left detailed message for pt. Pt made aware that labs sent to Dr Loreta Ave today via fax. Pt to return call with any additional questions or concerns.   Encounter closed.

## 2020-03-04 DIAGNOSIS — M25562 Pain in left knee: Secondary | ICD-10-CM | POA: Diagnosis not present

## 2020-03-04 DIAGNOSIS — M25551 Pain in right hip: Secondary | ICD-10-CM | POA: Diagnosis not present

## 2020-03-04 DIAGNOSIS — M545 Low back pain: Secondary | ICD-10-CM | POA: Diagnosis not present

## 2020-03-10 DIAGNOSIS — M25562 Pain in left knee: Secondary | ICD-10-CM | POA: Diagnosis not present

## 2020-03-10 DIAGNOSIS — M545 Low back pain: Secondary | ICD-10-CM | POA: Diagnosis not present

## 2020-03-10 DIAGNOSIS — M25551 Pain in right hip: Secondary | ICD-10-CM | POA: Diagnosis not present

## 2020-03-22 DIAGNOSIS — Z23 Encounter for immunization: Secondary | ICD-10-CM | POA: Diagnosis not present

## 2020-04-09 ENCOUNTER — Other Ambulatory Visit: Payer: Self-pay | Admitting: Gastroenterology

## 2020-04-09 DIAGNOSIS — Z86010 Personal history of colonic polyps: Secondary | ICD-10-CM | POA: Diagnosis not present

## 2020-04-09 DIAGNOSIS — Z1211 Encounter for screening for malignant neoplasm of colon: Secondary | ICD-10-CM | POA: Diagnosis not present

## 2020-04-09 DIAGNOSIS — K219 Gastro-esophageal reflux disease without esophagitis: Secondary | ICD-10-CM | POA: Diagnosis not present

## 2020-04-09 DIAGNOSIS — R7989 Other specified abnormal findings of blood chemistry: Secondary | ICD-10-CM

## 2020-04-09 DIAGNOSIS — R748 Abnormal levels of other serum enzymes: Secondary | ICD-10-CM | POA: Diagnosis not present

## 2020-04-12 DIAGNOSIS — Z23 Encounter for immunization: Secondary | ICD-10-CM | POA: Diagnosis not present

## 2020-04-21 ENCOUNTER — Other Ambulatory Visit: Payer: BC Managed Care – PPO

## 2020-04-30 ENCOUNTER — Ambulatory Visit
Admission: RE | Admit: 2020-04-30 | Discharge: 2020-04-30 | Disposition: A | Discharge status: Home / Self Care | Payer: BC Managed Care – PPO | Source: Ambulatory Visit | Attending: Gastroenterology | Admitting: Gastroenterology

## 2020-04-30 DIAGNOSIS — R7989 Other specified abnormal findings of blood chemistry: Secondary | ICD-10-CM

## 2020-04-30 DIAGNOSIS — K760 Fatty (change of) liver, not elsewhere classified: Secondary | ICD-10-CM | POA: Diagnosis not present

## 2020-05-28 DIAGNOSIS — E039 Hypothyroidism, unspecified: Secondary | ICD-10-CM | POA: Diagnosis not present

## 2020-06-08 ENCOUNTER — Other Ambulatory Visit: Payer: Self-pay | Admitting: Endocrinology

## 2020-06-08 DIAGNOSIS — E039 Hypothyroidism, unspecified: Secondary | ICD-10-CM

## 2020-06-08 DIAGNOSIS — Z23 Encounter for immunization: Secondary | ICD-10-CM | POA: Diagnosis not present

## 2020-06-08 DIAGNOSIS — E063 Autoimmune thyroiditis: Secondary | ICD-10-CM | POA: Diagnosis not present

## 2020-06-08 DIAGNOSIS — E669 Obesity, unspecified: Secondary | ICD-10-CM | POA: Diagnosis not present

## 2020-06-16 ENCOUNTER — Ambulatory Visit
Admission: RE | Admit: 2020-06-16 | Discharge: 2020-06-16 | Disposition: A | Discharge status: Home / Self Care | Payer: BC Managed Care – PPO | Source: Ambulatory Visit | Attending: Endocrinology | Admitting: Endocrinology

## 2020-06-16 DIAGNOSIS — E039 Hypothyroidism, unspecified: Secondary | ICD-10-CM

## 2020-07-29 ENCOUNTER — Other Ambulatory Visit: Payer: Self-pay

## 2020-07-29 NOTE — Telephone Encounter (Signed)
Medication refill request: Levothyroxine Last AEX:  01/30/20 Dr. Oscar La Next AEX: none Last MMG (if hormonal medication request): n/a Refill authorized: today, please advise

## 2020-07-30 NOTE — Telephone Encounter (Signed)
I have in the note that Dr Talmage Nap is seeing her for her Thyroid. If she is checking the lab work it is logical that she would fill the script. Please call and check with the patient, confirm she is still seeing Dr Talmage Nap. She would need a repeat TSH if it hasn't been done by Dr Talmage Nap.

## 2020-08-03 NOTE — Telephone Encounter (Signed)
Spoke with pt. Pt states had Rx refill done by Dr Talmage Nap last week. Pt not in need of refill from Dr Oscar La. Pt states has refills and labs with Dr Talmage Nap.  Rx request refused.  Routing to Dr Oscar La for update.  Encounter closed

## 2020-08-04 DIAGNOSIS — Z1331 Encounter for screening for depression: Secondary | ICD-10-CM | POA: Diagnosis not present

## 2020-08-04 DIAGNOSIS — E039 Hypothyroidism, unspecified: Secondary | ICD-10-CM | POA: Diagnosis not present

## 2020-08-04 DIAGNOSIS — K760 Fatty (change of) liver, not elsewhere classified: Secondary | ICD-10-CM | POA: Diagnosis not present

## 2020-08-06 DIAGNOSIS — Z20822 Contact with and (suspected) exposure to covid-19: Secondary | ICD-10-CM | POA: Diagnosis not present

## 2020-08-31 DIAGNOSIS — Z1211 Encounter for screening for malignant neoplasm of colon: Secondary | ICD-10-CM | POA: Diagnosis not present

## 2020-10-15 DIAGNOSIS — Z1152 Encounter for screening for COVID-19: Secondary | ICD-10-CM | POA: Diagnosis not present

## 2020-11-30 DIAGNOSIS — L814 Other melanin hyperpigmentation: Secondary | ICD-10-CM | POA: Diagnosis not present

## 2020-11-30 DIAGNOSIS — L819 Disorder of pigmentation, unspecified: Secondary | ICD-10-CM | POA: Diagnosis not present

## 2020-11-30 DIAGNOSIS — D2371 Other benign neoplasm of skin of right lower limb, including hip: Secondary | ICD-10-CM | POA: Diagnosis not present

## 2020-11-30 DIAGNOSIS — D692 Other nonthrombocytopenic purpura: Secondary | ICD-10-CM | POA: Diagnosis not present

## 2020-12-08 IMAGING — MG MM DIGITAL DIAGNOSTIC UNILAT*R* W/ TOMO W/ CAD
4 series · 4 of 12 positions shown · non-contrast
Comparison: Previous exam(s).

CLINICAL DATA: Patient recalled from screening for right breast
asymmetry.

EXAM:
DIGITAL DIAGNOSTIC UNILATERAL RIGHT MAMMOGRAM WITH CAD AND TOMO

[R ML synth-2D]
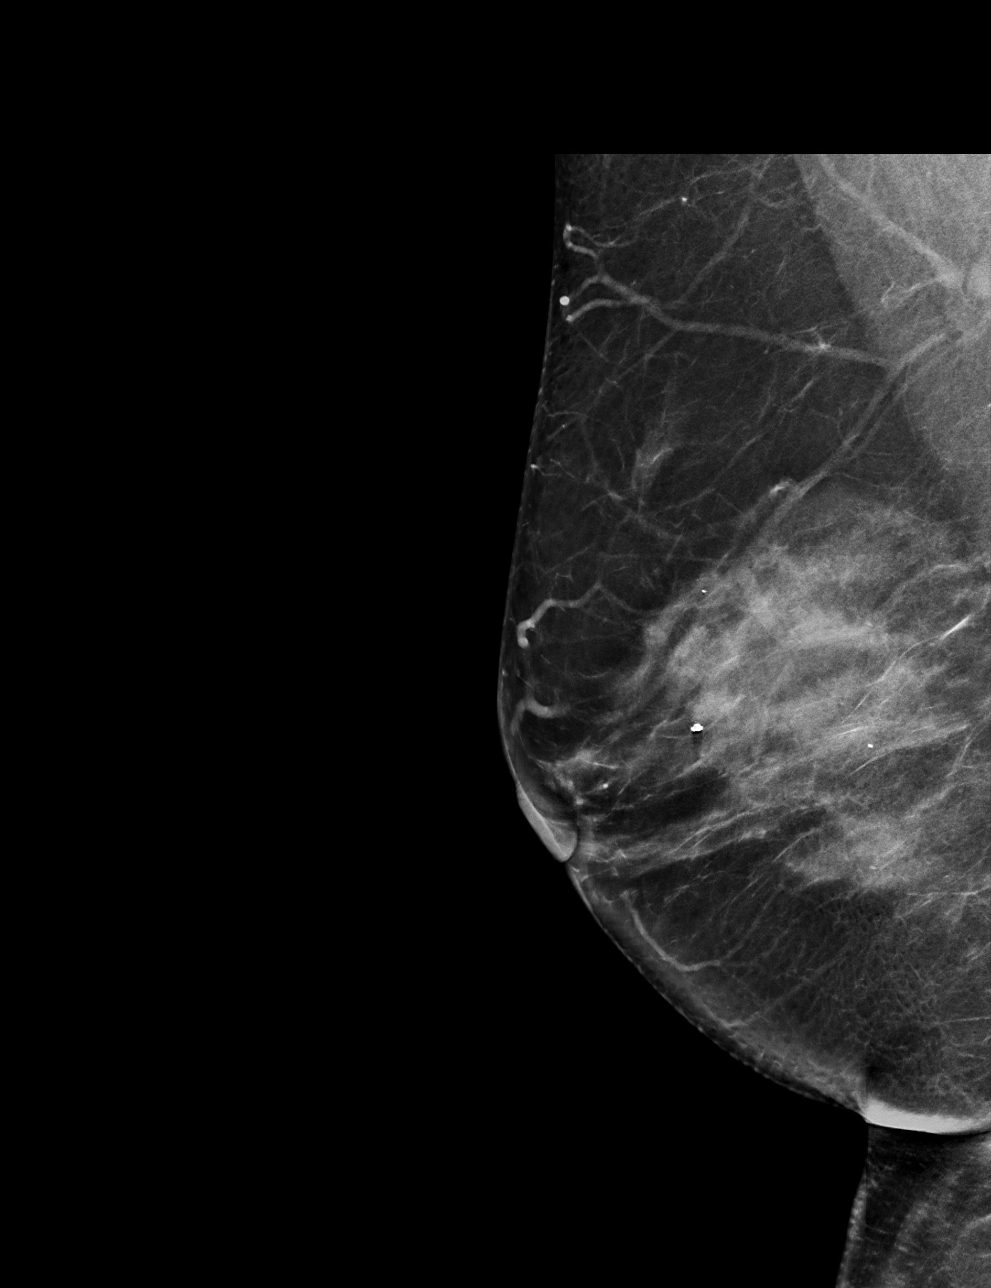

[R MLO synth-2D]
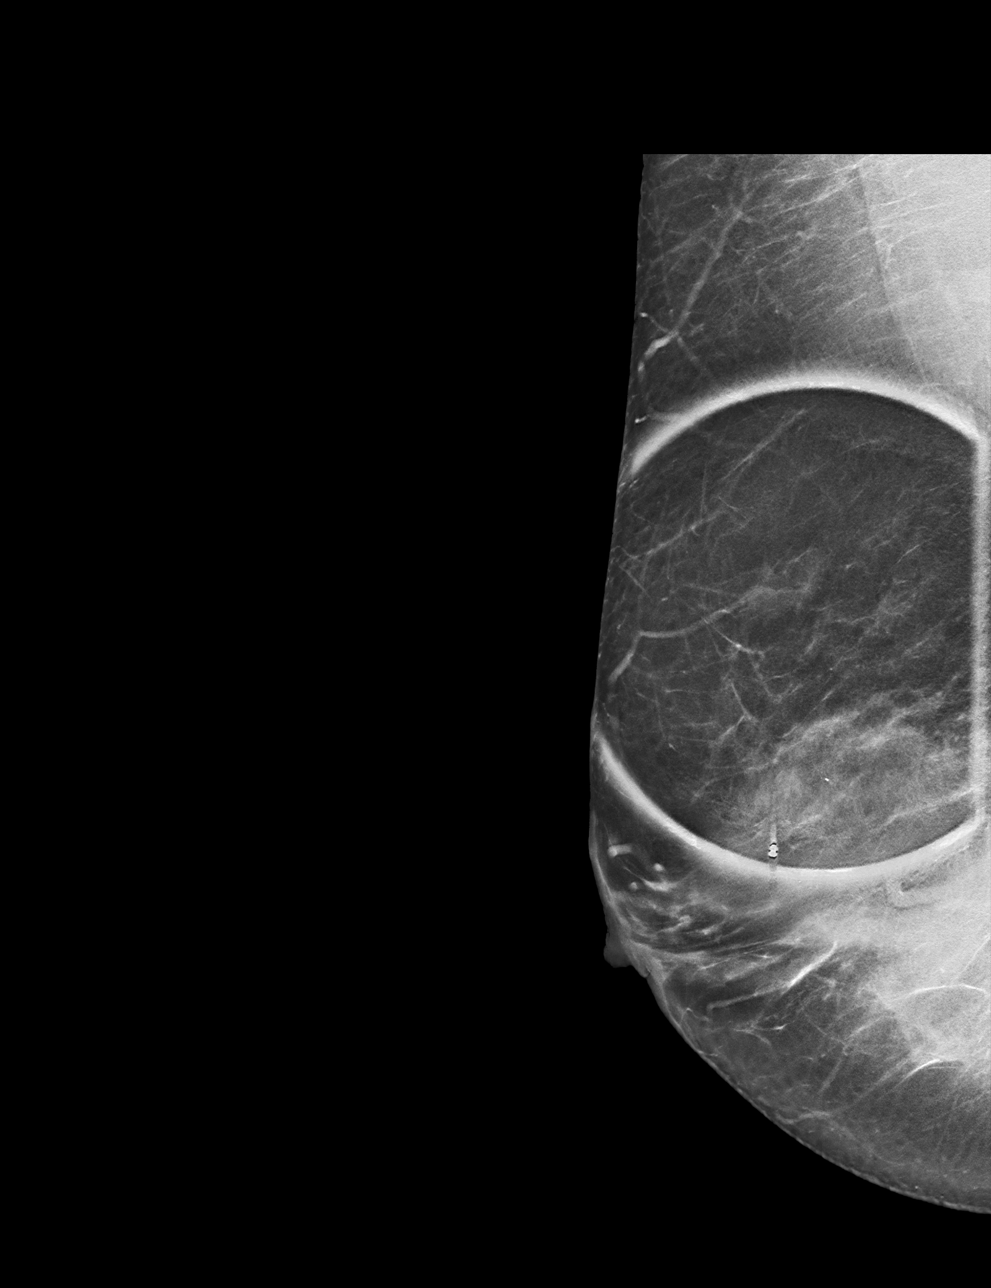

[R ML tomo · tomo slice 39/77.0]
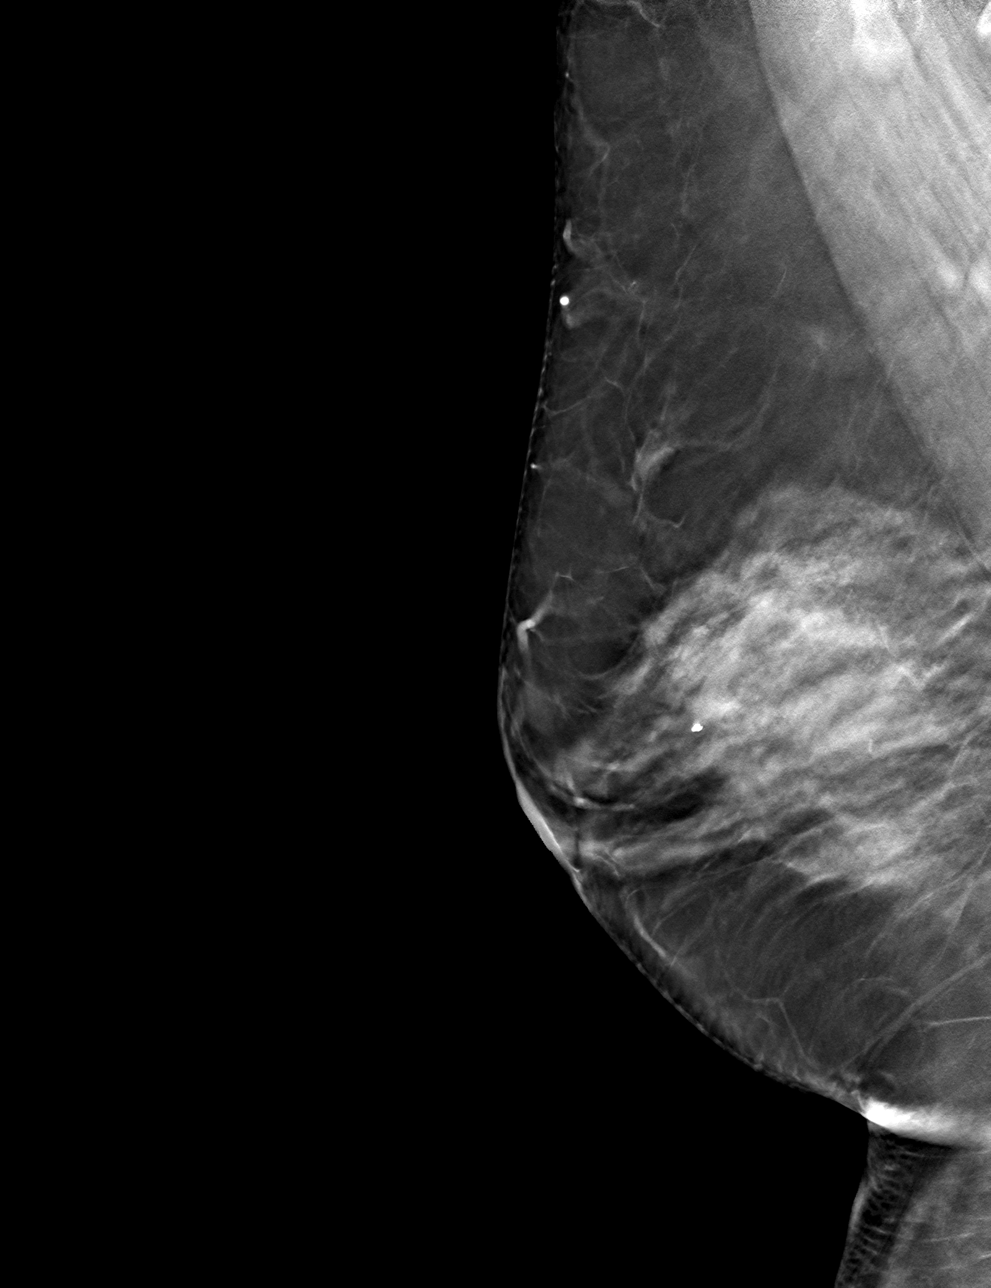

[R MLO tomo · tomo slice 33/66.0]
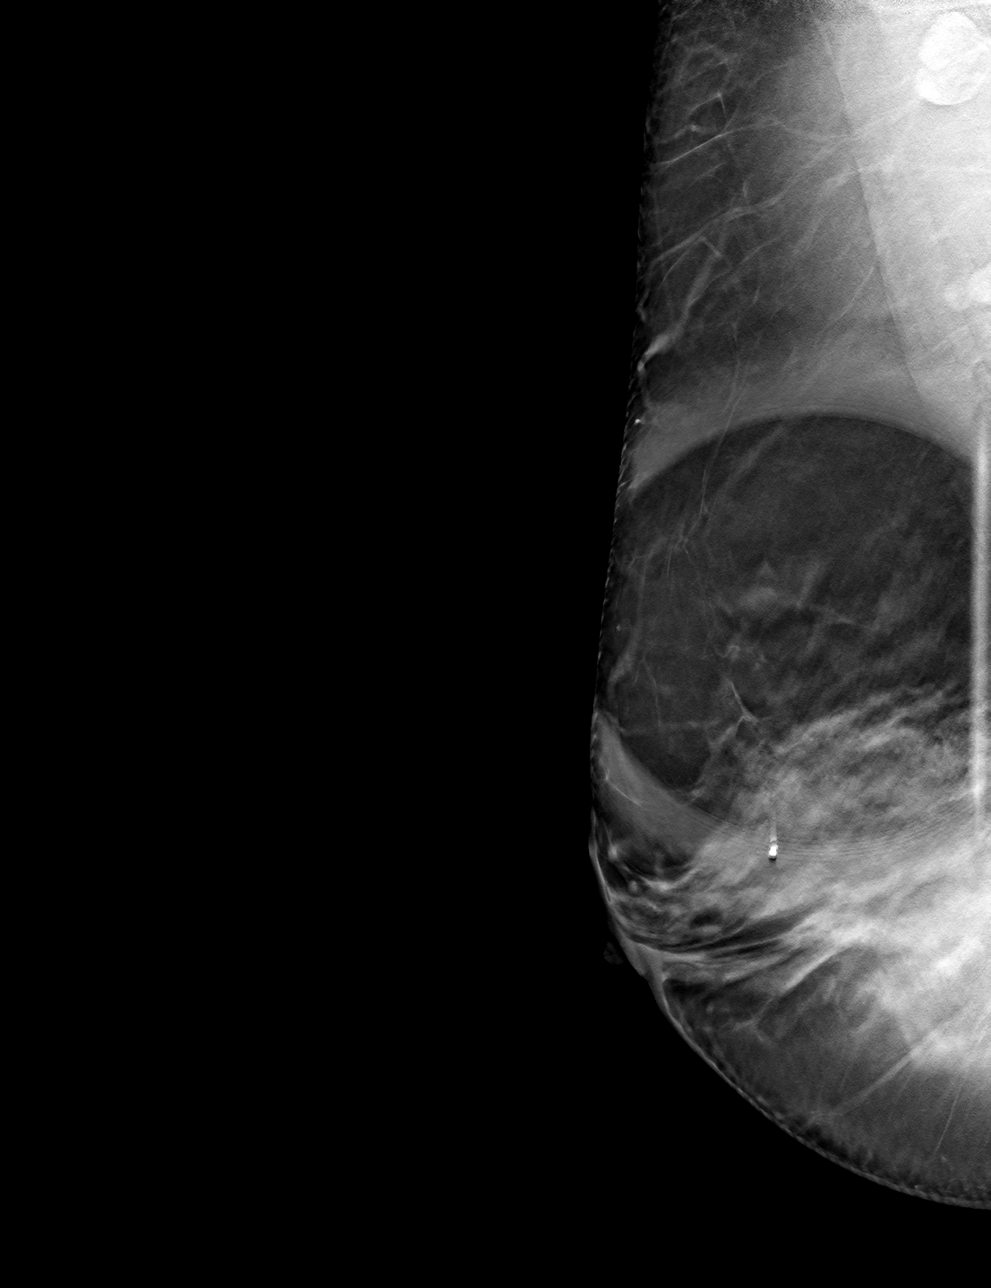

[4 of 12 positions shown; findings below may reference images not displayed]

ACR Breast Density Category c: The breast tissue is heterogeneously
dense, which may obscure small masses.
FINDINGS: Additional imaging demonstrates questioned asymmetry within the
superior aspect of the right breast to efface with additional
imaging. Findings on the right MLO view are stable dating back to
7554, compatible with benign process.

Mammographic images were processed with CAD.
IMPRESSION: No mammographic evidence for malignancy.

RECOMMENDATION:
Screening mammogram in one year.(Code:TS-2-L6T)

I have discussed the findings and recommendations with the patient.
If applicable, a reminder letter will be sent to the patient
regarding the next appointment.

BI-RADS CATEGORY  1: Negative.

## 2020-12-23 DIAGNOSIS — H5203 Hypermetropia, bilateral: Secondary | ICD-10-CM | POA: Diagnosis not present

## 2020-12-23 DIAGNOSIS — H2513 Age-related nuclear cataract, bilateral: Secondary | ICD-10-CM | POA: Diagnosis not present

## 2020-12-23 DIAGNOSIS — H524 Presbyopia: Secondary | ICD-10-CM | POA: Diagnosis not present

## 2021-01-07 DIAGNOSIS — E039 Hypothyroidism, unspecified: Secondary | ICD-10-CM | POA: Diagnosis not present

## 2021-01-12 DIAGNOSIS — Z1331 Encounter for screening for depression: Secondary | ICD-10-CM | POA: Diagnosis not present

## 2021-01-12 DIAGNOSIS — Z1389 Encounter for screening for other disorder: Secondary | ICD-10-CM | POA: Diagnosis not present

## 2021-01-12 DIAGNOSIS — Z0000 Encounter for general adult medical examination without abnormal findings: Secondary | ICD-10-CM | POA: Diagnosis not present

## 2021-01-12 DIAGNOSIS — E039 Hypothyroidism, unspecified: Secondary | ICD-10-CM | POA: Diagnosis not present

## 2021-02-02 DIAGNOSIS — E669 Obesity, unspecified: Secondary | ICD-10-CM | POA: Diagnosis not present

## 2021-02-02 DIAGNOSIS — Z86010 Personal history of colonic polyps: Secondary | ICD-10-CM | POA: Diagnosis not present

## 2021-02-02 DIAGNOSIS — K200 Eosinophilic esophagitis: Secondary | ICD-10-CM | POA: Diagnosis not present

## 2021-03-08 ENCOUNTER — Other Ambulatory Visit: Payer: Self-pay | Admitting: Obstetrics and Gynecology

## 2021-03-08 DIAGNOSIS — Z1231 Encounter for screening mammogram for malignant neoplasm of breast: Secondary | ICD-10-CM

## 2021-03-16 ENCOUNTER — Ambulatory Visit
Admission: RE | Admit: 2021-03-16 | Discharge: 2021-03-16 | Disposition: A | Discharge status: Home / Self Care | Payer: BC Managed Care – PPO | Source: Ambulatory Visit | Attending: Obstetrics and Gynecology | Admitting: Obstetrics and Gynecology

## 2021-03-16 ENCOUNTER — Other Ambulatory Visit: Payer: Self-pay

## 2021-03-16 DIAGNOSIS — Z1231 Encounter for screening mammogram for malignant neoplasm of breast: Secondary | ICD-10-CM

## 2021-04-01 IMAGING — US US ABDOMEN LIMITED
1 series · 14 of 25 positions shown · non-contrast
Comparison: CT from 10/20/2014

CLINICAL DATA: Elevated LFTs

EXAM:
ULTRASOUND ABDOMEN LIMITED RIGHT UPPER QUADRANT

[Series 1: us abdomen limited · 0.22mm/px · 14 of 57 slices shown]
[im 1/57]
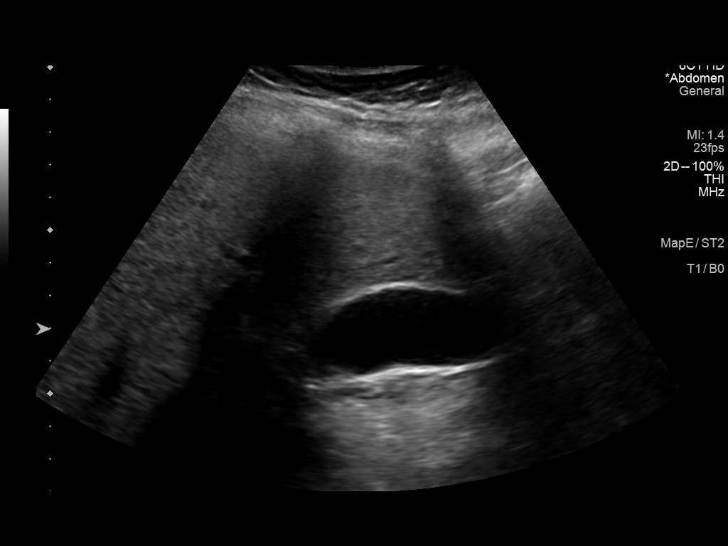
[im 5/57]
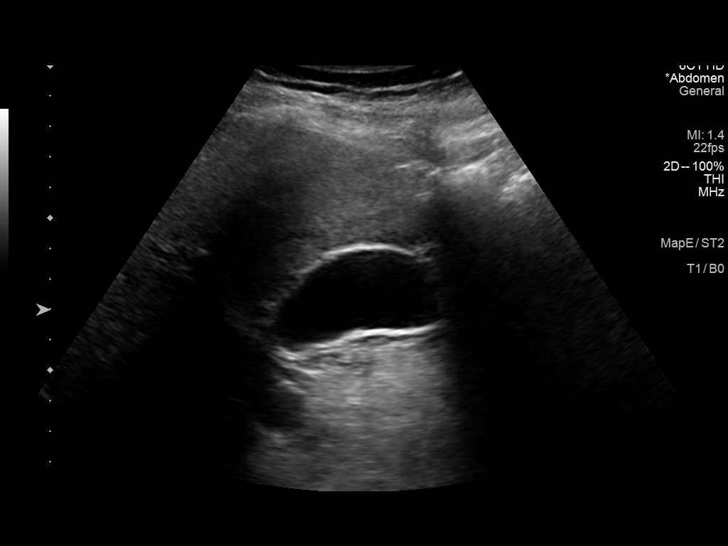
[im 10/57]
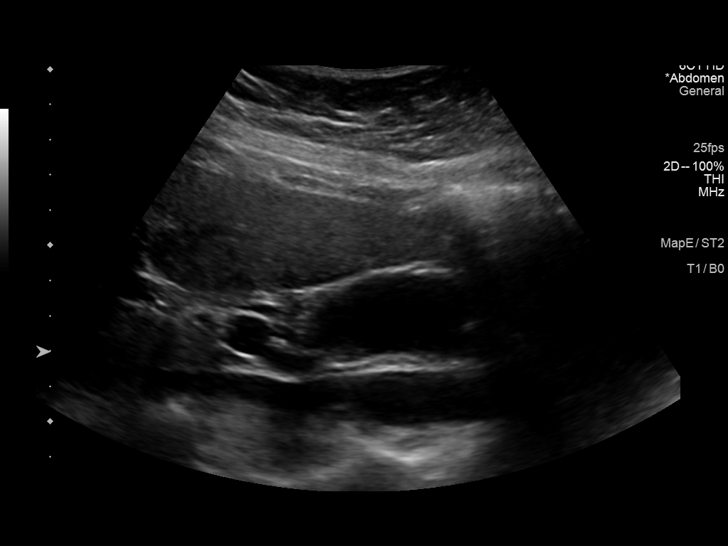
[im 15/57]
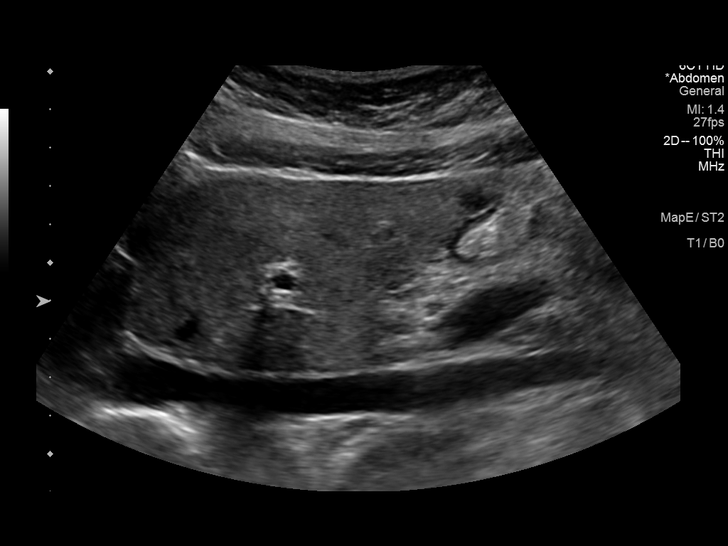
[im 19/57]
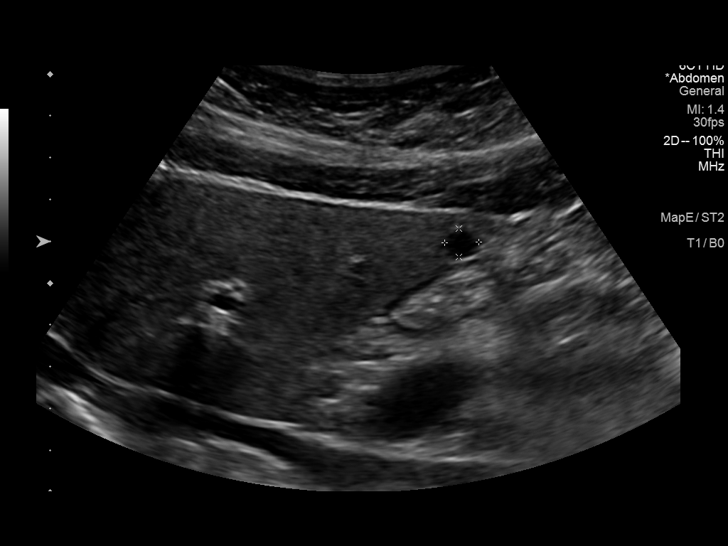
[im 22/57]
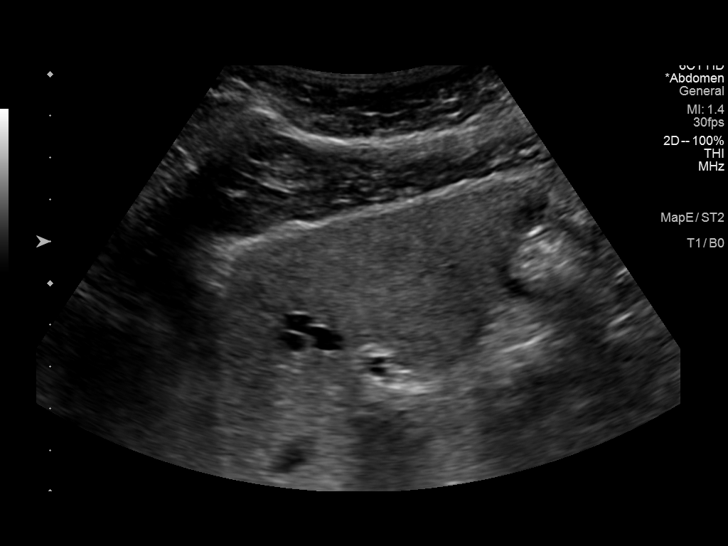
[im 26/57]
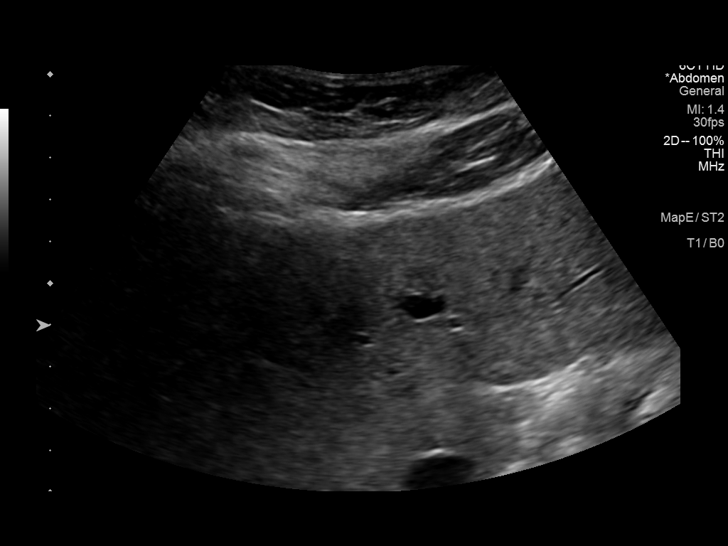
[im 31/57]
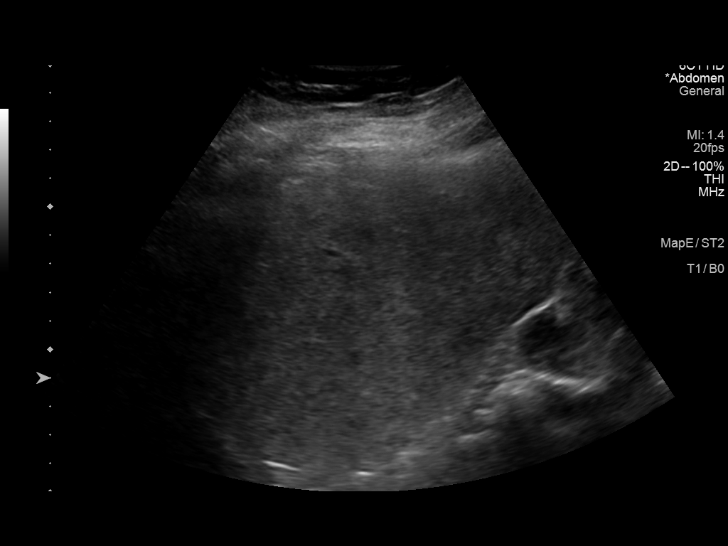
[im 36/57]
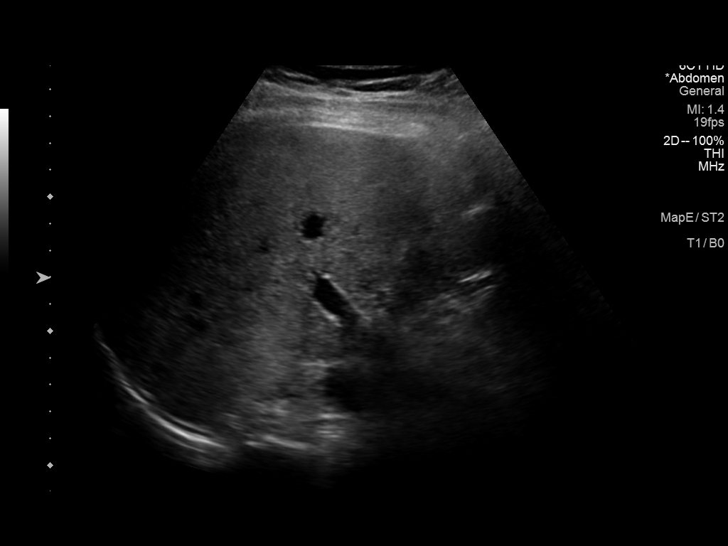
[im 38/57]
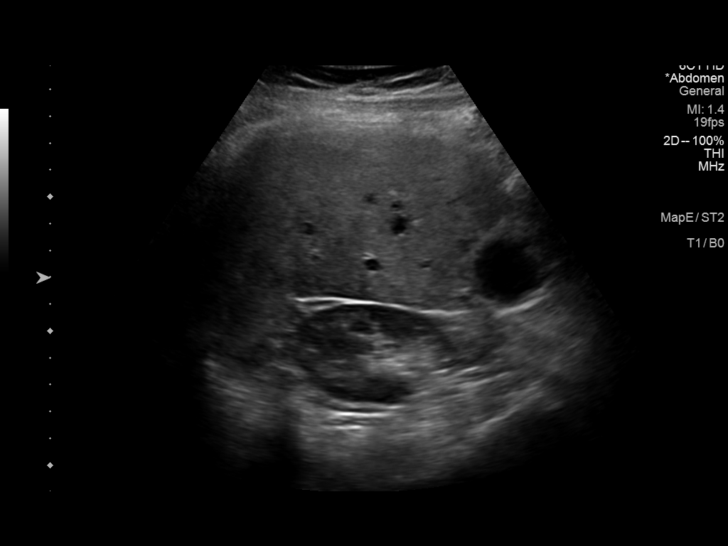
[im 43/57]
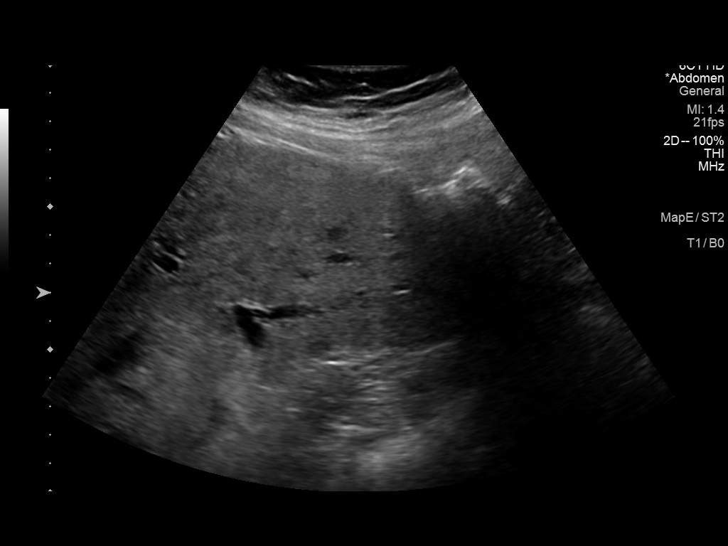
[im 47/57]
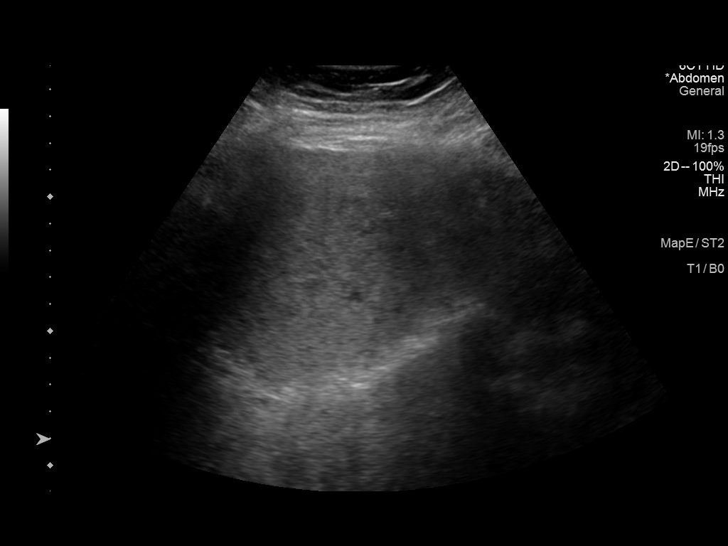
[im 52/57]
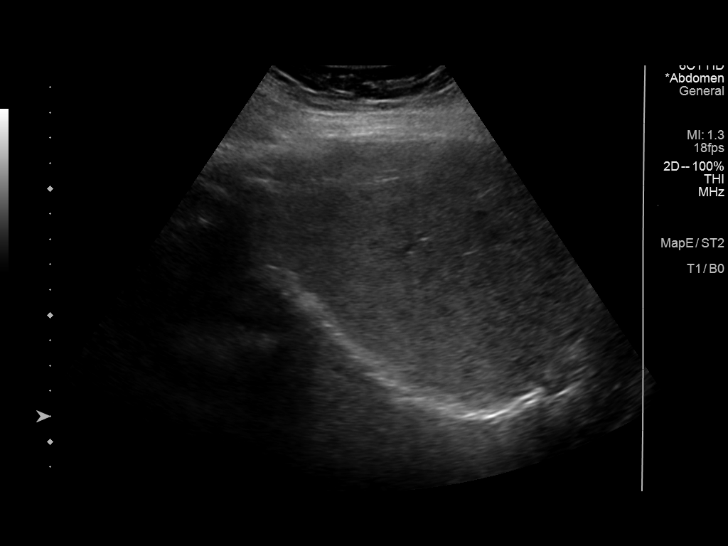
[im 57/57]
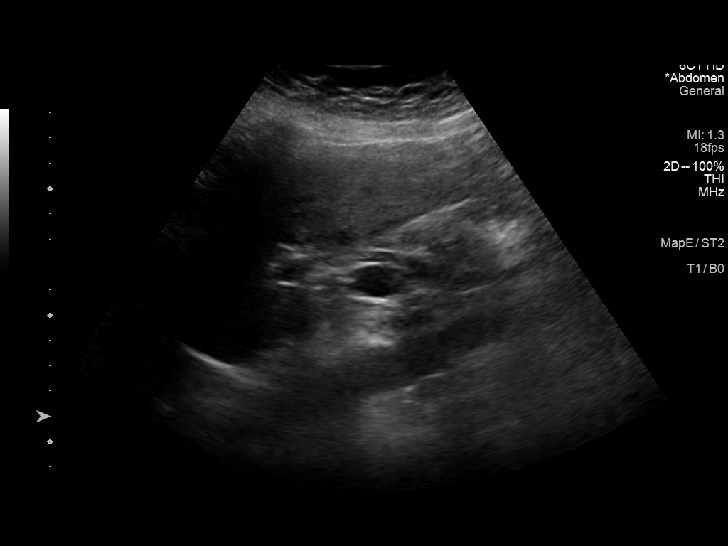

[14 of 25 positions shown; findings below may reference images not displayed]

FINDINGS: Gallbladder:

No gallstones or wall thickening visualized. No sonographic Murphy
sign noted by sonographer.

Common bile duct:

Diameter: 2.4 mm.

Liver:

Slight increased echogenicity is noted likely related to fatty
infiltration. 8 mm cyst is noted within the left lobe of the liver.
Additionally there are 3 adjacent small cysts with a total
measurement of approximately 8 mm. No other focal mass is noted.
Portal vein is patent on color Doppler imaging with normal direction
of blood flow towards the liver.

Other: None.
IMPRESSION: Fatty infiltration of the liver.

Cystic changes within the liver. These appear stable from a prior CT
examination of 0879.

## 2021-05-18 IMAGING — US US THYROID
1 series · 14 of 25 positions shown · non-contrast
Comparison: None.

CLINICAL DATA: Hypothyroidism

EXAM:
THYROID ULTRASOUND
TECHNIQUE: Ultrasound examination of the thyroid gland and adjacent soft
tissues was performed.

[Series 1: us thyroid · 0.06mm/px · 14 of 41 slices shown]
[im 1/41]
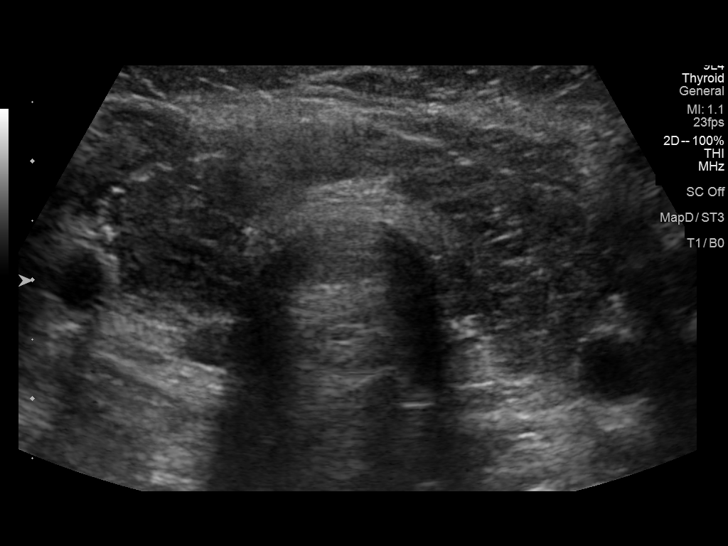
[im 4/41]
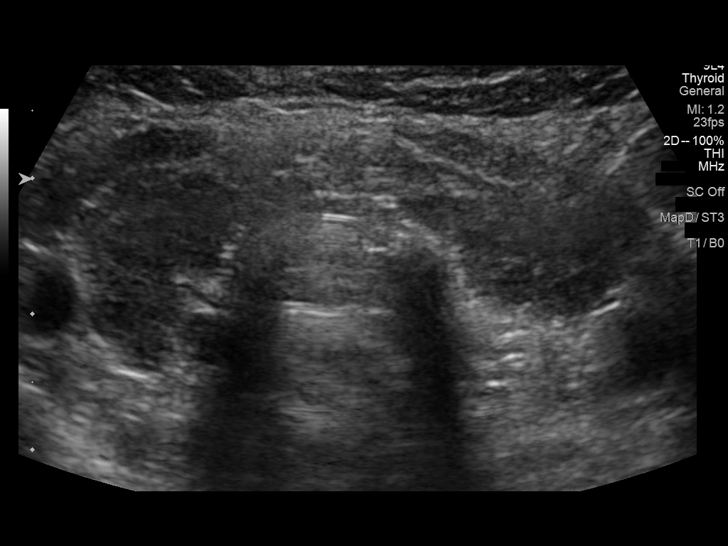
[im 7/41]
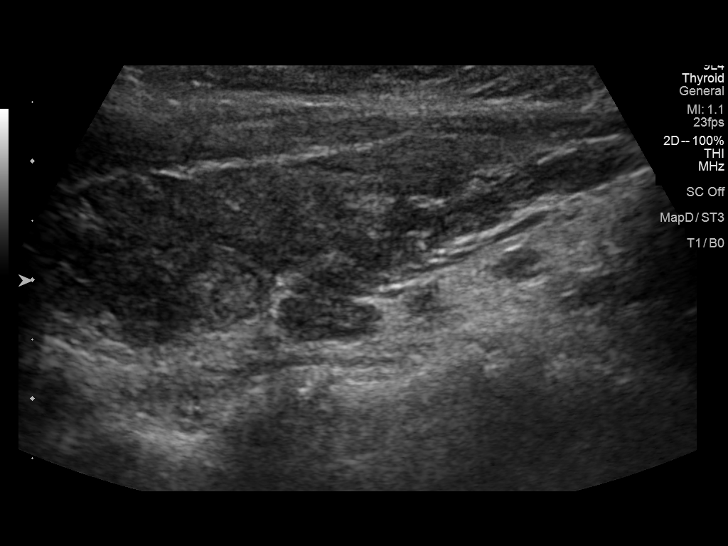
[im 11/41]
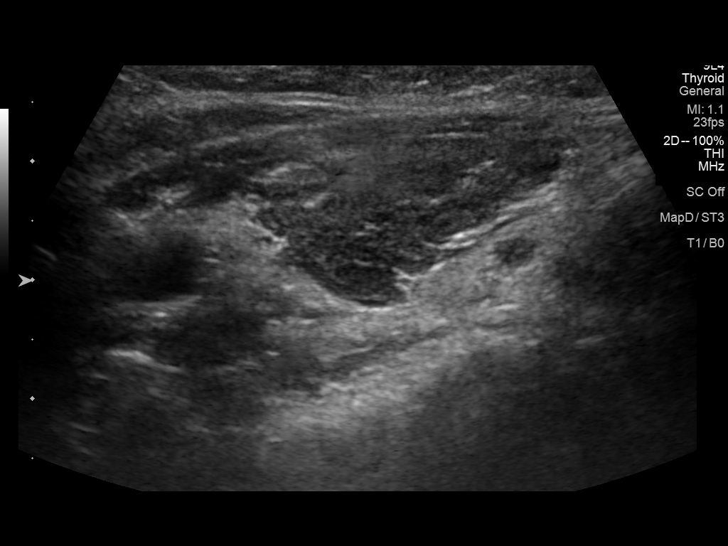
[im 14/41]
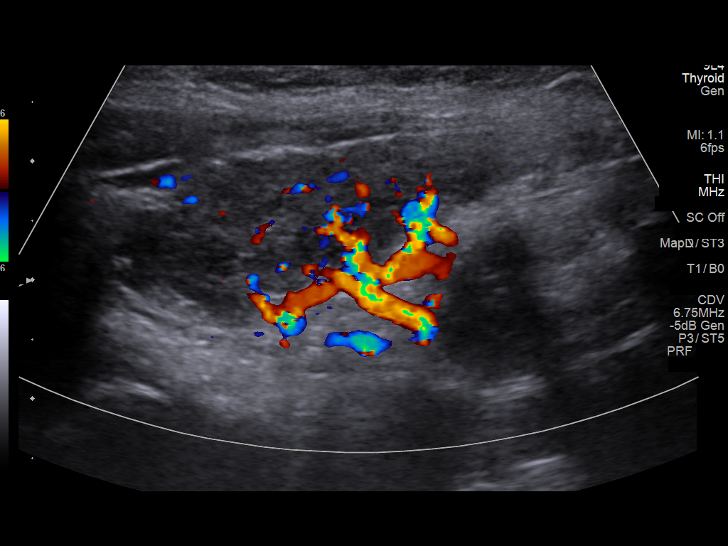
[im 16/41]
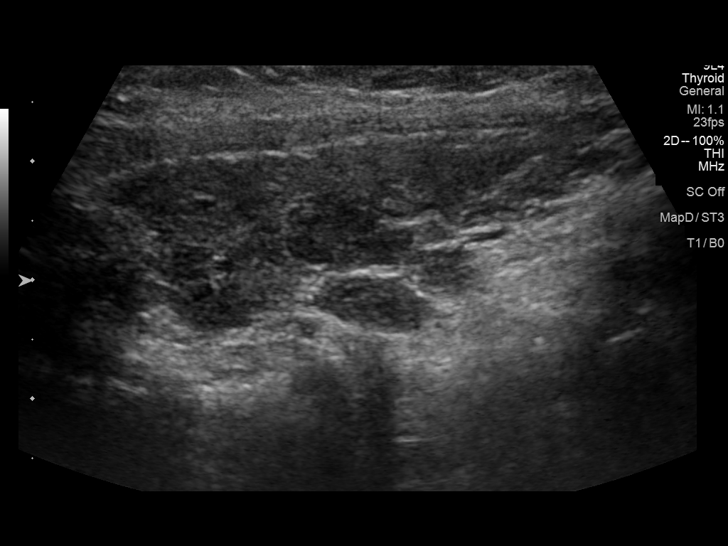
[im 19/41]
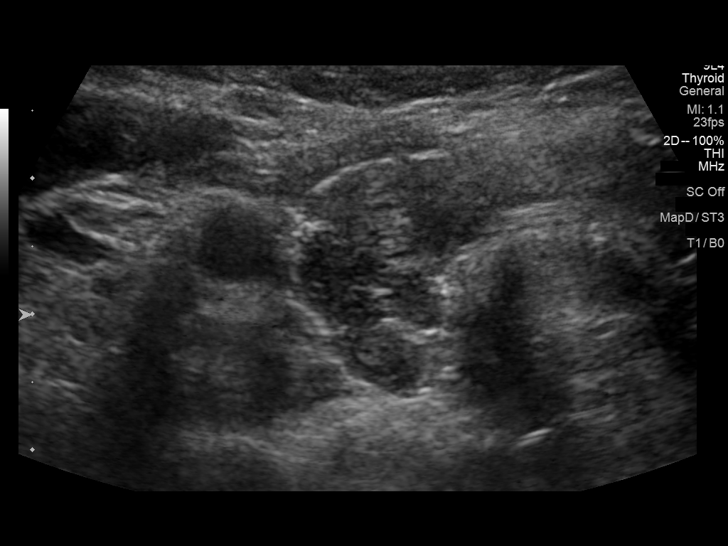
[im 22/41]
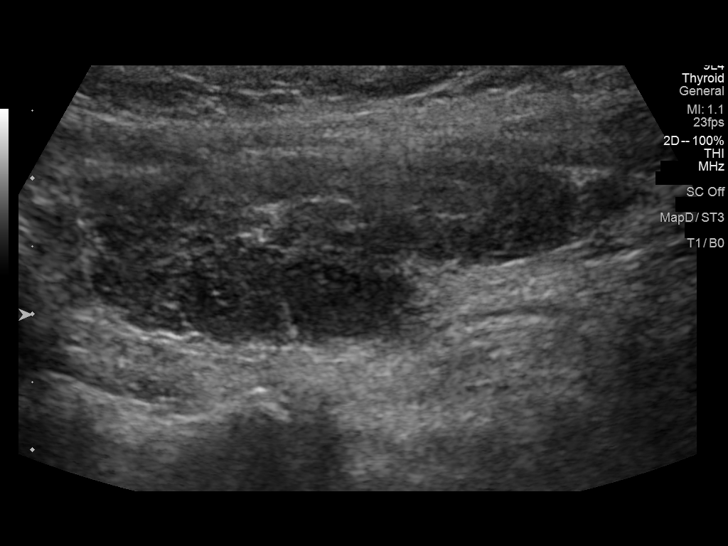
[im 26/41]
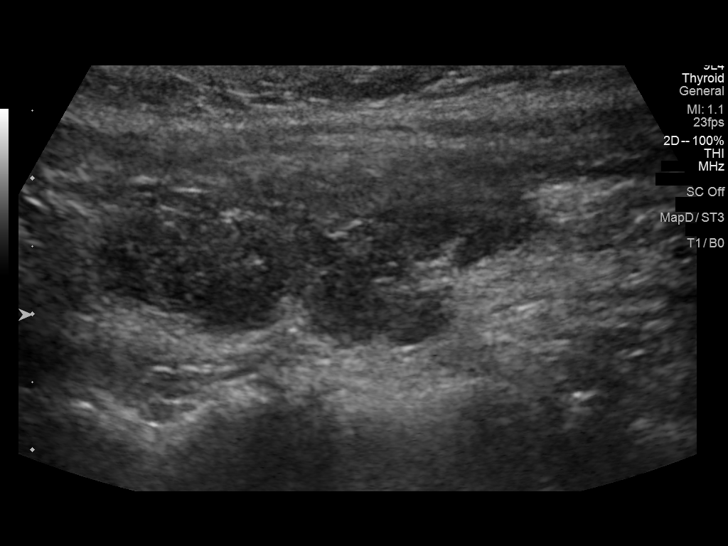
[im 27/41]
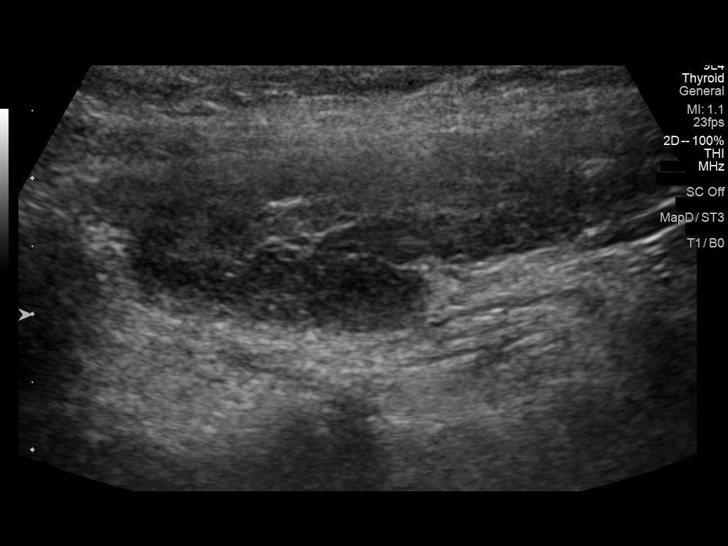
[im 31/41]
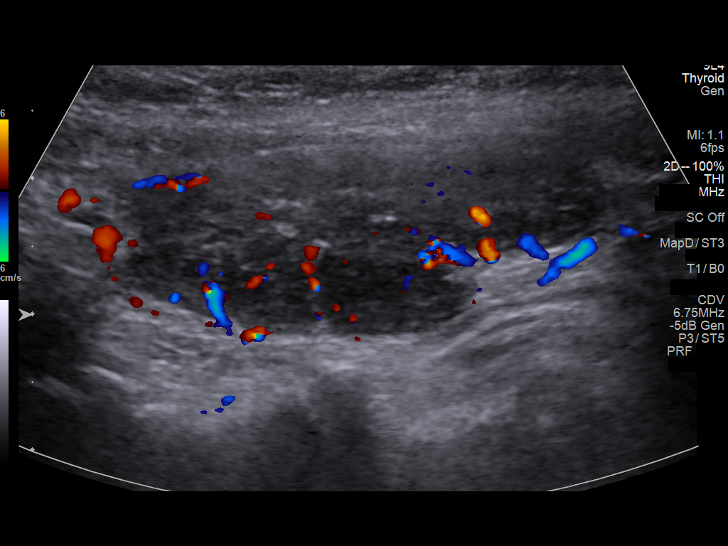
[im 34/41]
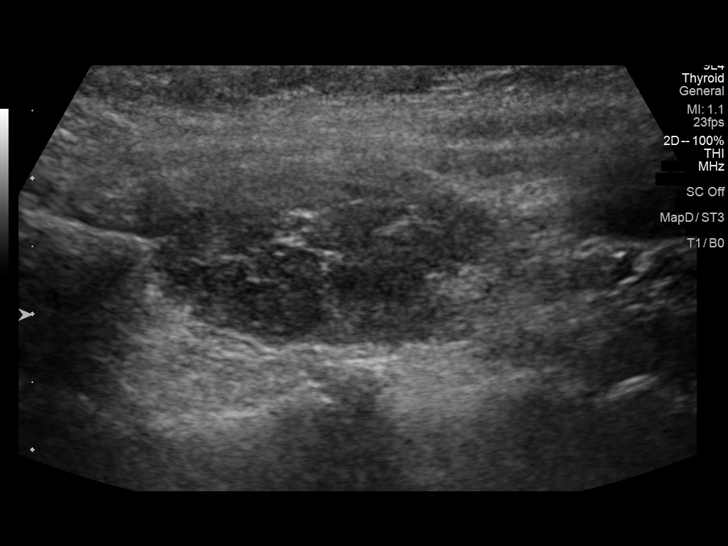
[im 37/41]
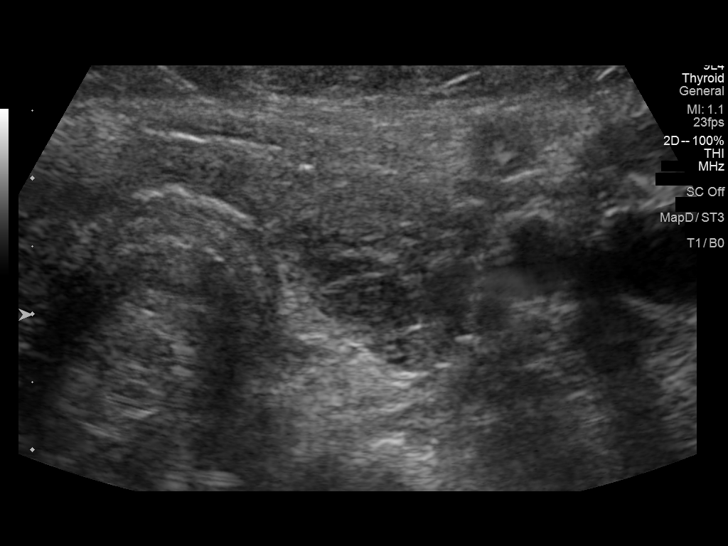
[im 41/41]
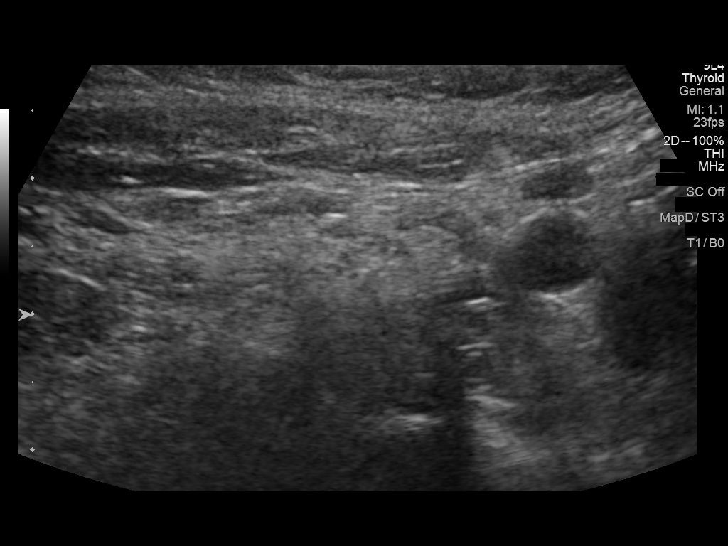

[14 of 25 positions shown; findings below may reference images not displayed]

FINDINGS: Parenchymal Echotexture: Markedly heterogenous

Isthmus: 4 mm

Right lobe: 5.2 x 1.7 x 1.2 cm

Left lobe: 4.1 x 1.5 x 1 7 cm

_________________________________________________________

Estimated total number of nodules >/= 1 cm: 0

Number of spongiform nodules >/=  2 cm not described below (TR1): 0

Number of mixed cystic and solid nodules >/= 1.5 cm not described
below (TR2): 0

_________________________________________________________

Marked thyroid heterogeneity. Thyroid is diffusely hypoechoic with
background pseudo nodularity. No discrete nodule or focal
abnormality. No hypervascularity. No regional adenopathy.
IMPRESSION: Marked thyroid heterogeneity, compatible with medical thyroid
disease or sequelae of prior thyroiditis.

No significant abnormality that warrants follow-up or biopsy.

The above is in keeping with the ACR TI-RADS recommendations - [HOSPITAL] 8727;[DATE].

## 2021-06-16 DIAGNOSIS — R5383 Other fatigue: Secondary | ICD-10-CM | POA: Diagnosis not present

## 2021-06-16 DIAGNOSIS — R051 Acute cough: Secondary | ICD-10-CM | POA: Diagnosis not present

## 2021-06-30 DIAGNOSIS — E039 Hypothyroidism, unspecified: Secondary | ICD-10-CM | POA: Diagnosis not present

## 2021-07-27 DIAGNOSIS — R0981 Nasal congestion: Secondary | ICD-10-CM | POA: Diagnosis not present

## 2021-07-27 DIAGNOSIS — R051 Acute cough: Secondary | ICD-10-CM | POA: Diagnosis not present

## 2021-07-27 DIAGNOSIS — J40 Bronchitis, not specified as acute or chronic: Secondary | ICD-10-CM | POA: Diagnosis not present

## 2021-07-30 DIAGNOSIS — E039 Hypothyroidism, unspecified: Secondary | ICD-10-CM | POA: Diagnosis not present

## 2021-07-30 DIAGNOSIS — E063 Autoimmune thyroiditis: Secondary | ICD-10-CM | POA: Diagnosis not present

## 2021-07-30 DIAGNOSIS — E669 Obesity, unspecified: Secondary | ICD-10-CM | POA: Diagnosis not present

## 2021-12-01 ENCOUNTER — Other Ambulatory Visit: Payer: Self-pay | Admitting: Endocrinology

## 2021-12-01 DIAGNOSIS — E063 Autoimmune thyroiditis: Secondary | ICD-10-CM

## 2021-12-03 ENCOUNTER — Ambulatory Visit
Admission: RE | Admit: 2021-12-03 | Discharge: 2021-12-03 | Disposition: A | Discharge status: Home / Self Care | Payer: 59 | Source: Ambulatory Visit | Attending: Endocrinology | Admitting: Endocrinology

## 2021-12-03 DIAGNOSIS — E063 Autoimmune thyroiditis: Secondary | ICD-10-CM

## 2022-02-16 ENCOUNTER — Other Ambulatory Visit: Payer: Self-pay | Admitting: Obstetrics and Gynecology

## 2022-02-16 DIAGNOSIS — Z1231 Encounter for screening mammogram for malignant neoplasm of breast: Secondary | ICD-10-CM

## 2022-03-22 ENCOUNTER — Ambulatory Visit
Admission: RE | Admit: 2022-03-22 | Discharge: 2022-03-22 | Disposition: A | Discharge status: Home / Self Care | Payer: 59 | Source: Ambulatory Visit | Attending: Obstetrics and Gynecology | Admitting: Obstetrics and Gynecology

## 2022-03-22 DIAGNOSIS — Z1231 Encounter for screening mammogram for malignant neoplasm of breast: Secondary | ICD-10-CM

## 2022-06-23 NOTE — Progress Notes (Signed)
56 y.o. G42P2002 Married White or Caucasian Not Hispanic or Latino female here for annual exam.  No vaginal bleeding. Not sexually active, okay with it. No h/o dyspareunia.  She has tolerable vasomotor symptoms.   No bowel or bladder c/o. She got constipated on ozempic, got a hemorrhoid, currently fine (off of ozempic).    H/O lobular carcinoma in situ of the right breast, s/p lumpectomy in 2016    Genetic testing only + for a variant, not thought to be significant.     Patient's last menstrual period was 06/29/2019.          Sexually active: No.  The current method of family planning is post menopausal status.    Exercising: Yes.     Walking and swimming Smoker:  no  Health Maintenance: Pap:   01/03/2018 WNL NEG HPV, 12-03-14 WNL NEG HR HPV 12-22-11 WNL  History of abnormal Pap:  no MMG:  03/24/22 denisty C Bi-rads 1 neg  BMD:   never Colonoscopy: 08/31/20 Normal F/U 5 years  TDaP:  12-03-14 Gardasil: N/A   reports that she has never smoked. She has never used smokeless tobacco. She reports current alcohol use of about 1.0 standard drink of alcohol per week. 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  Past Medical History:  Diagnosis Date   Breast mass, right    carcinoma in situ   Genetic testing 01/26/2018   Multi-Cancer panel (83 genes) @ [facility] - No pathogenic mutations detected   GERD (gastroesophageal reflux disease)    Hypothyroidism    Thyroid disease    sees Dr. Chalmers Cater    Past Surgical History:  Procedure Laterality Date   BREAST BIOPSY Right    BREAST EXCISIONAL BIOPSY Left    BREAST LUMPECTOMY WITH RADIOACTIVE SEED LOCALIZATION Right 04/16/2015   Procedure: RIGHT BREAST LUMPECTOMY WITH RADIOACTIVE SEED LOCALIZATION;  Surgeon: Erroll Luna, MD;  Location: [place];  Service: General;  Laterality: Right;   BREAST SURGERY     age 18--benign breast mass removed   lipoma removal  12/2009   left groin  area--Dr. Trina Ao   LUMBAR DISC SURGERY  12/2013   --Dr. Sherley Bounds    Current Outpatient Medications  Medication Sig Dispense Refill   levothyroxine (SYNTHROID) 125 MCG tablet Take 1 tablet (125 mcg total) by mouth daily. One po qd 90 tablet 3   omeprazole (PRILOSEC) 40 MG capsule Take 40 mg by mouth every morning.     ondansetron (ZOFRAN-ODT) 8 MG disintegrating tablet Take 8 mg by mouth daily as needed.     buPROPion (WELLBUTRIN XL) 150 MG 24 hr tablet Take 150 mg by mouth every morning. (Patient not taking: Reported on 06/30/2022)     valACYclovir (VALTREX) 1000 MG tablet Take 2 tablets (2000 mg) by mouth every 12 hours x 2 doses prn cold sores. 30 tablet 1   No current facility-administered medications for this visit.    Family History  Problem Relation Age of Onset   Parkinson's disease Mother    Diabetes Father        AODM   Prostate cancer Father 56       deceased 56   Cancer Father 11       cancer unknown  origin   Heart attack Maternal Grandfather    Multiple myeloma Paternal Grandmother 68       deceased 2s   Cancer Paternal Uncle  unk. primary; deceaesd late 35s    Review of Systems  Endocrine:       Hair loss    Exam:   BP 106/68 (BP Location: Right Arm, Patient Position: Sitting)   Pulse 81   Resp 20   Ht 5' 10.08" (1.78 m)   Wt 184 lb 9.6 oz (83.7 kg)   LMP 06/29/2019   SpO2 98%   BMI 26.43 kg/m   Weight change: _0 @ Height:   Height: 5' 10.08" (178 cm)  Ht Readings from Last 3 Encounters:  06/30/22 5' 10.08" (1.78 m)  01/30/20 5' 10.5" (1.791 m)  01/24/19 5' 9.75" (1.772 m)    General appearance: alert, cooperative and appears stated age Head: Normocephalic, without obvious abnormality, atraumatic Neck: no adenopathy, supple, symmetrical, trachea midline and thyroid normal to inspection and palpation Lungs: clear to auscultation bilaterally Cardiovascular: regular rate and rhythm Breasts: normal appearance, no  masses or tenderness Abdomen: soft, non-tender; non distended,  no masses,  no organomegaly Extremities: extremities normal, atraumatic, no cyanosis or edema Skin: Skin color, texture, turgor normal. No rashes or lesions Lymph nodes: Cervical, supraclavicular, and axillary nodes normal. No abnormal inguinal nodes palpated Neurologic: Grossly normal   Pelvic: External genitalia:  no lesions              Urethra:  normal appearing urethra with no masses, tenderness or lesions              Bartholins and Skenes: normal                 Vagina: normal appearing vagina with normal color and discharge, no lesions              Cervix: no lesions               Bimanual Exam:  Uterus:  normal size, contour, position, consistency, mobility, non-tender and retroverted              Adnexa: no mass, fullness, tenderness               Rectovaginal: Confirms               Anus:  normal sphincter tone, no lesions  Glorianne Manchester, RN chaperoned for the exam.  1. Well woman exam Discussed breast self exam Discussed calcium and vit D intake Mammogram and colonoscopy UTD Labs with primary  2. Screening for cervical cancer - Cytology - PAP  3. H/O cold sores - valACYclovir (VALTREX) 1000 MG tablet; Take 2 tablets (2000 mg) by mouth every 12 hours x 2 doses prn cold sores.  Dispense: 30 tablet; Refill: 1

## 2022-06-30 ENCOUNTER — Ambulatory Visit (INDEPENDENT_AMBULATORY_CARE_PROVIDER_SITE_OTHER): Payer: 59 | Admitting: Obstetrics and Gynecology

## 2022-06-30 ENCOUNTER — Encounter: Payer: Self-pay | Admitting: Obstetrics and Gynecology

## 2022-06-30 ENCOUNTER — Other Ambulatory Visit (HOSPITAL_COMMUNITY)
Admission: RE | Admit: 2022-06-30 | Discharge: 2022-06-30 | Disposition: A | Discharge status: Home / Self Care | Payer: 59 | Source: Ambulatory Visit | Attending: Obstetrics and Gynecology | Admitting: Obstetrics and Gynecology

## 2022-06-30 VITALS — BP 106/68 | HR 81 | Resp 20 | Ht 70.08 in | Wt 184.6 lb

## 2022-06-30 DIAGNOSIS — Z124 Encounter for screening for malignant neoplasm of cervix: Secondary | ICD-10-CM | POA: Insufficient documentation

## 2022-06-30 DIAGNOSIS — Z8619 Personal history of other infectious and parasitic diseases: Secondary | ICD-10-CM

## 2022-06-30 DIAGNOSIS — Z01419 Encounter for gynecological examination (general) (routine) without abnormal findings: Secondary | ICD-10-CM | POA: Diagnosis not present

## 2022-06-30 MED ORDER — VALACYCLOVIR HCL 1 G PO TABS: ORAL_TABLET | ORAL | 1 refills | Status: AC

## 2022-06-30 NOTE — Patient Instructions (Signed)

## 2022-07-01 LAB — CYTOLOGY - PAP
Comment: NEGATIVE
Diagnosis: NEGATIVE
High risk HPV: NEGATIVE

## 2022-07-21 ENCOUNTER — Encounter: Payer: Self-pay | Admitting: Obstetrics and Gynecology

## 2022-07-21 ENCOUNTER — Ambulatory Visit (INDEPENDENT_AMBULATORY_CARE_PROVIDER_SITE_OTHER): Payer: 59 | Admitting: Obstetrics and Gynecology

## 2022-07-21 VITALS — BP 110/64 | Temp 97.8°F | Wt 183.0 lb

## 2022-07-21 DIAGNOSIS — N3090 Cystitis, unspecified without hematuria: Secondary | ICD-10-CM

## 2022-07-21 LAB — URINALYSIS, COMPLETE
Bilirubin Urine: NEGATIVE
Casts: NONE SEEN /LPF
Crystals: NONE SEEN /HPF
Glucose, UA: NEGATIVE
Hyaline Cast: NONE SEEN /LPF
Nitrite: POSITIVE — AB
Specific Gravity, Urine: 1.021 (ref 1.001–1.035)
WBC, UA: >=60 /HPF — AB (ref 0–5)
Yeast: NONE SEEN /HPF
pH: 6 (ref 5.0–8.0)

## 2022-07-21 MED ORDER — PHENAZOPYRIDINE HCL 200 MG PO TABS: 200.0000 mg | ORAL_TABLET | Freq: Three times a day (TID) | ORAL | 0 refills | Status: DC

## 2022-07-21 MED ORDER — SULFAMETHOXAZOLE-TRIMETHOPRIM 800-160 MG PO TABS: 1.0000 | ORAL_TABLET | Freq: Two times a day (BID) | ORAL | 0 refills | Status: DC

## 2022-07-21 NOTE — Progress Notes (Signed)
GYNECOLOGY  VISIT   HPI: 56 y.o.   Married White or Caucasian Not Hispanic or Latino  female   [phone] with Patient's last menstrual period was 06/29/2019.   here for  dysuria, frequency, and bladder pain. Symptoms started on Saturday.  She is using azo, last used it yesterday.  No fevers or flank pain.   She was recently treated for a UTI with her primary. Symptoms resolved for 2 weeks.   GYNECOLOGIC HISTORY: Patient's last menstrual period was 06/29/2019. Contraception:post menopausal.  Menopausal hormone therapy: none         OB History     Gravida  2   Para  2   Term  2   Preterm      AB      Living  2      SAB      IAB      Ectopic      Multiple      Live Births                 Patient Active Problem List   Diagnosis Date Noted   Genetic testing 01/26/2018   Lobular carcinoma in situ of right breast 06/07/2015   Atypical lobular hyperplasia of right breast 06/07/2015   UPPER RESPIRATORY INFECTION, ACUTE, WITH BRONCHITIS 10/05/2009   SLEEPLESSNESS 03/19/2008   HEADACHE 03/19/2008   SINUSITIS- ACUTE-NOS 06/18/2007    Past Medical History:  Diagnosis Date   Breast mass, right    carcinoma in situ   Genetic testing 01/26/2018   Multi-Cancer panel (83 genes) @ [facility] - No pathogenic mutations detected   GERD (gastroesophageal reflux disease)    Hypothyroidism    Thyroid disease    sees Dr. Chalmers Cater    Past Surgical History:  Procedure Laterality Date   BREAST BIOPSY Right    BREAST EXCISIONAL BIOPSY Left    BREAST LUMPECTOMY WITH RADIOACTIVE SEED LOCALIZATION Right 04/16/2015   Procedure: RIGHT BREAST LUMPECTOMY WITH RADIOACTIVE SEED LOCALIZATION;  Surgeon: Erroll Luna, MD;  Location: [place];  Service: General;  Laterality: Right;   BREAST SURGERY     age 61--benign breast mass removed   lipoma removal  12/2009   left groin area--Dr. Trina Ao   LUMBAR DISC SURGERY  12/2013   --Dr. Sherley Bounds     Current Outpatient Medications  Medication Sig Dispense Refill   buPROPion (WELLBUTRIN XL) 150 MG 24 hr tablet Take 150 mg by mouth every morning. (Patient not taking: Reported on 06/30/2022)     levothyroxine (SYNTHROID) 125 MCG tablet Take 1 tablet (125 mcg total) by mouth daily. One po qd 90 tablet 3   omeprazole (PRILOSEC) 40 MG capsule Take 40 mg by mouth every morning.     ondansetron (ZOFRAN-ODT) 8 MG disintegrating tablet Take 8 mg by mouth daily as needed.     valACYclovir (VALTREX) 1000 MG tablet Take 2 tablets (2000 mg) by mouth every 12 hours x 2 doses prn cold sores. 30 tablet 1   No current facility-administered medications for this visit.     ALLERGIES: Patient has no known allergies.  Family History  Problem Relation Age of Onset   Parkinson's disease Mother    Diabetes Father        AODM   Prostate cancer Father 26       deceased 62   Cancer Father 49       cancer unknown  origin   Heart attack Maternal Grandfather    Multiple myeloma  Paternal Grandmother 90       deceased 36s   Cancer Paternal Uncle        unk. primary; deceaesd late 42s    Social History   Socioeconomic History   Marital status: Married    Spouse name: Not on file   Number of children: Not on file   Years of education: Not on file   Highest education level: Not on file  Occupational History   Not on file  Tobacco Use   Smoking status: Never   Smokeless tobacco: Never  Vaping Use   Vaping Use: Never used  Substance and Sexual Activity   Alcohol use: Yes    Alcohol/week: 1.0 standard drink of alcohol    Types: 1 Standard drinks or equivalent per week    Comment: social   Drug use: No   Sexual activity: Not Currently    Partners: Male    Birth control/protection: Abstinence  Other Topics Concern   Not on file  Social History Narrative   Not on file   Social Determinants of Health   Financial Resource Strain: Not on file  Food Insecurity: Not on file  Transportation  Needs: Not on file  Physical Activity: Not on file  Stress: Not on file  Social Connections: Not on file  Intimate Partner Violence: Not on file    Review of Systems  All other systems reviewed and are negative.   PHYSICAL EXAMINATION:    BP 110/64   Temp 97.8 F (36.6 C)   Wt 183 lb (83 kg)   LMP 06/29/2019   BMI 26.20 kg/m     General appearance: alert, cooperative and appears stated age Abdomen: soft, tender in the suprapubic region, non distended, no masses,  no organomegaly CVA: Not tender  1. Cystitis - sulfamethoxazole-trimethoprim (BACTRIM DS) 800-160 MG tablet; Take 1 tablet by mouth 2 (two) times daily. One PO BID x 3 days  Dispense: 6 tablet; Refill: 0 - phenazopyridine (PYRIDIUM) 200 MG tablet; Take 1 tablet (200 mg total) by mouth 3 (three) times daily with meals.  Dispense: 6 tablet; Refill: 0 - Urinalysis, Complete - Urine Culture -Call if not feeling better in 48 hours

## 2022-07-21 NOTE — Patient Instructions (Signed)
Urinary Tract Infection, Adult  A urinary tract infection (UTI) is an infection of any part of the urinary tract. The urinary tract includes the kidneys, ureters, bladder, and urethra. These organs make, store, and get rid of urine in the body. An upper UTI affects the ureters and kidneys. A lower UTI affects the bladder and urethra. What are the causes? Most urinary tract infections are caused by bacteria in your genital area around your urethra, where urine leaves your body. These bacteria grow and cause inflammation of your urinary tract. What increases the risk? You are more likely to develop this condition if: You have a urinary catheter that stays in place. You are not able to control when you urinate or have a bowel movement (incontinence). You are female and you: Use a spermicide or diaphragm for birth control. Have low estrogen levels. Are pregnant. You have certain genes that increase your risk. You are sexually active. You take antibiotic medicines. You have a condition that causes your flow of urine to slow down, such as: An enlarged prostate, if you are female. Blockage in your urethra. A kidney stone. A nerve condition that affects your bladder control (neurogenic bladder). Not getting enough to drink, or not urinating often. You have certain medical conditions, such as: Diabetes. A weak disease-fighting system (immunesystem). Sickle cell disease. Gout. Spinal cord injury. What are the signs or symptoms? Symptoms of this condition include: Needing to urinate right away (urgency). Frequent urination. This may include small amounts of urine each time you urinate. Pain or burning with urination. Blood in the urine. Urine that smells bad or unusual. Trouble urinating. Cloudy urine. Vaginal discharge, if you are female. Pain in the abdomen or the lower back. You may also have: Vomiting or a decreased appetite. Confusion. Irritability or tiredness. A fever or  chills. Diarrhea. The first symptom in older adults may be confusion. In some cases, they may not have any symptoms until the infection has worsened. How is this diagnosed? This condition is diagnosed based on your medical history and a physical exam. You may also have other tests, including: Urine tests. Blood tests. Tests for STIs (sexually transmitted infections). If you have had more than one UTI, a cystoscopy or imaging studies may be done to determine the cause of the infections. How is this treated? Treatment for this condition includes: Antibiotic medicine. Over-the-counter medicines to treat discomfort. Drinking enough water to stay hydrated. If you have frequent infections or have other conditions such as a kidney stone, you may need to see a health care provider who specializes in the urinary tract (urologist). In rare cases, urinary tract infections can cause sepsis. Sepsis is a life-threatening condition that occurs when the body responds to an infection. Sepsis is treated in the hospital with IV antibiotics, fluids, and other medicines. Follow these instructions at home:  Medicines Take over-the-counter and prescription medicines only as told by your health care provider. If you were prescribed an antibiotic medicine, take it as told by your health care provider. Do not stop using the antibiotic even if you start to feel better. General instructions Make sure you: Empty your bladder often and completely. Do not hold urine for long periods of time. Empty your bladder after sex. Wipe from front to back after urinating or having a bowel movement if you are female. Use each tissue only one time when you wipe. Drink enough fluid to keep your urine pale yellow. Keep all follow-up visits. This is important. Contact a health   care provider if: Your symptoms do not get better after 1-2 days. Your symptoms go away and then return. Get help right away if: You have severe pain in  your back or your lower abdomen. You have a fever or chills. You have nausea or vomiting. Summary A urinary tract infection (UTI) is an infection of any part of the urinary tract, which includes the kidneys, ureters, bladder, and urethra. Most urinary tract infections are caused by bacteria in your genital area. Treatment for this condition often includes antibiotic medicines. If you were prescribed an antibiotic medicine, take it as told by your health care provider. Do not stop using the antibiotic even if you start to feel better. Keep all follow-up visits. This is important. This information is not intended to replace advice given to you by your health care provider. Make sure you discuss any questions you have with your health care provider. Document Revised: 03/20/2020 Document Reviewed: 03/20/2020 Elsevier Patient Education  2023 Else[employer] Inc[employer]  

## 2022-07-24 LAB — URINE CULTURE
MICRO NUMBER:: 14251986
SPECIMEN QUALITY:: ADEQUATE

## 2022-11-04 IMAGING — US US THYROID
1 series · 14 of 25 positions shown · non-contrast
Comparison: 05/27/2020

CLINICAL DATA: Autoimmune thyroiditis

EXAM:
THYROID ULTRASOUND
TECHNIQUE: Ultrasound examination of the thyroid gland and adjacent soft
tissues was performed.

[Series 1: us thyroid · 0.07mm/px · 14 of 35 slices shown]
[im 1/35]
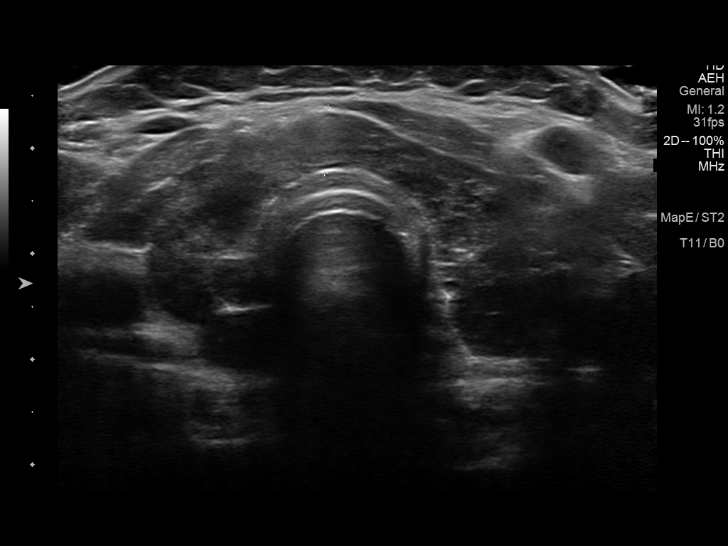
[im 3/35]
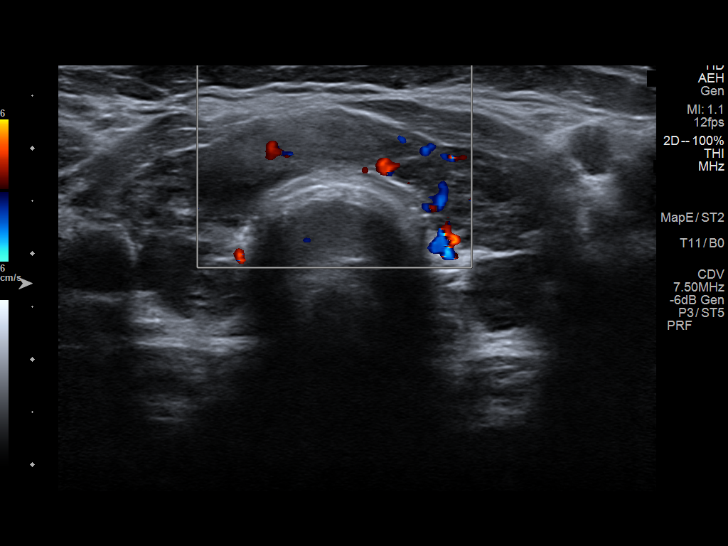
[im 6/35]
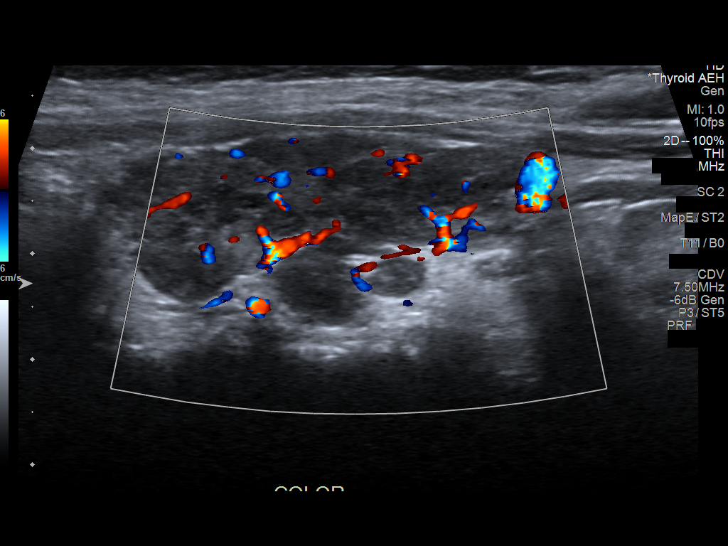
[im 9/35]
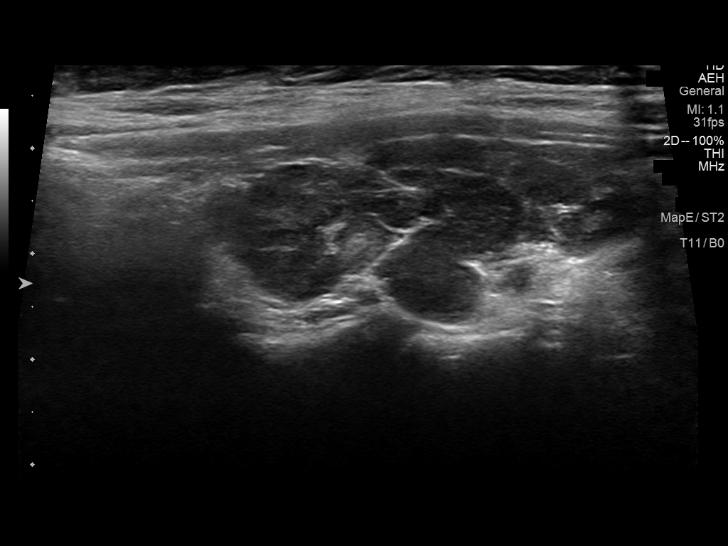
[im 12/35]
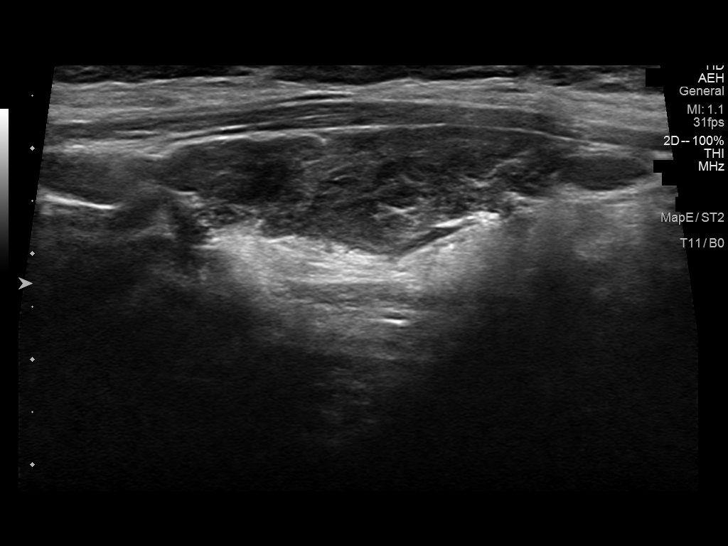
[im 13/35]
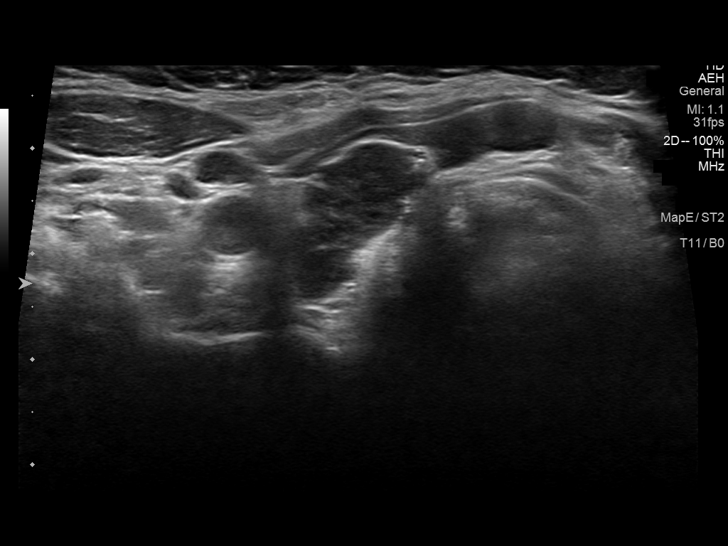
[im 16/35]
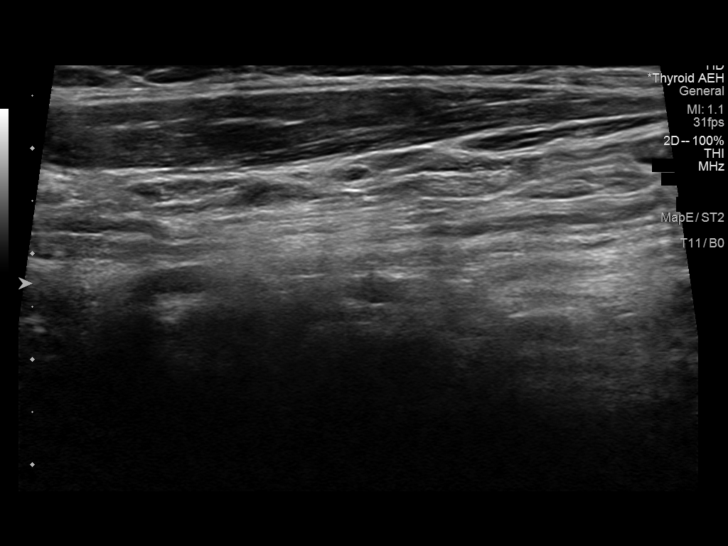
[im 19/35]
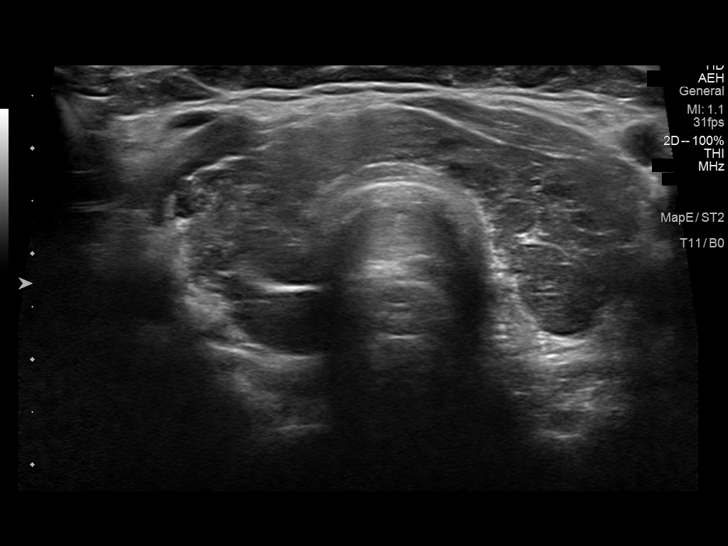
[im 22/35]
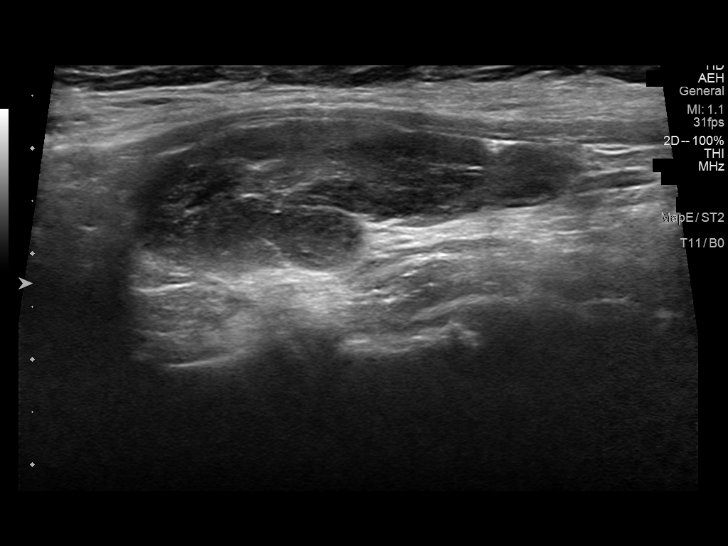
[im 23/35]
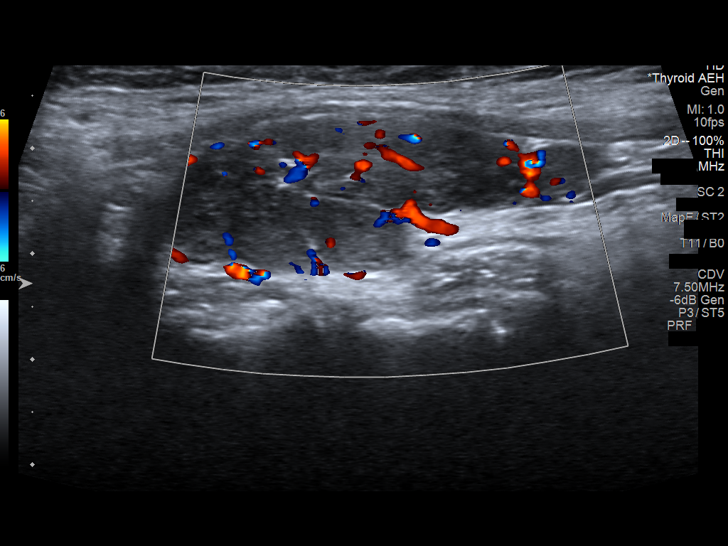
[im 26/35]
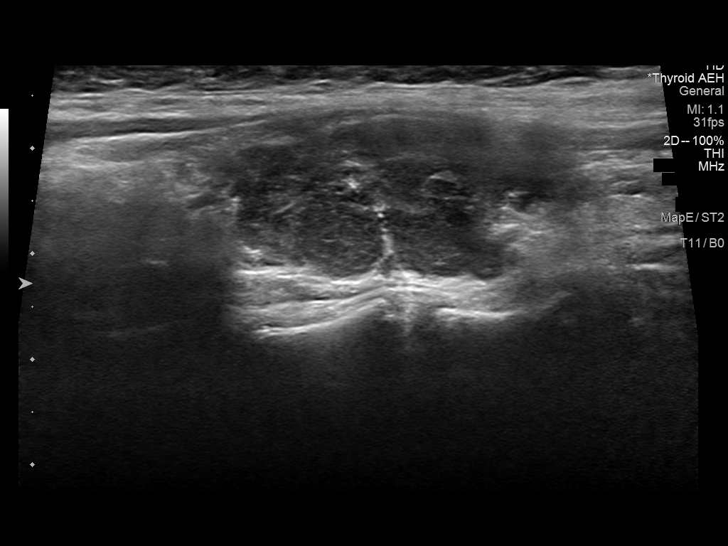
[im 29/35]
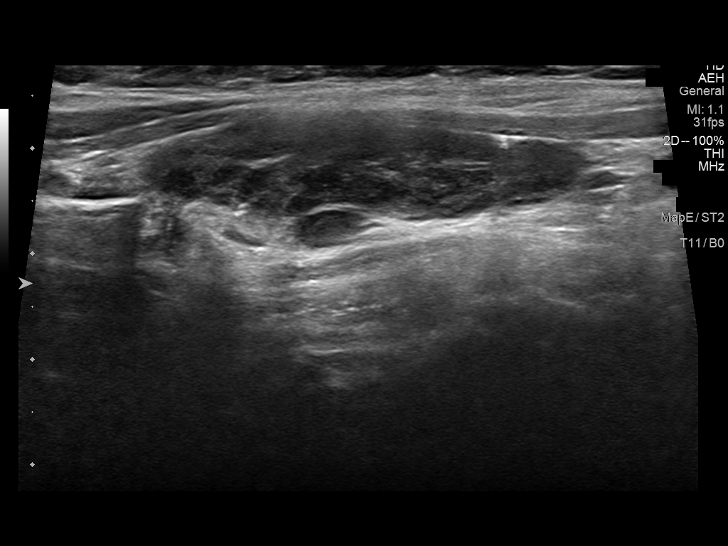
[im 32/35]
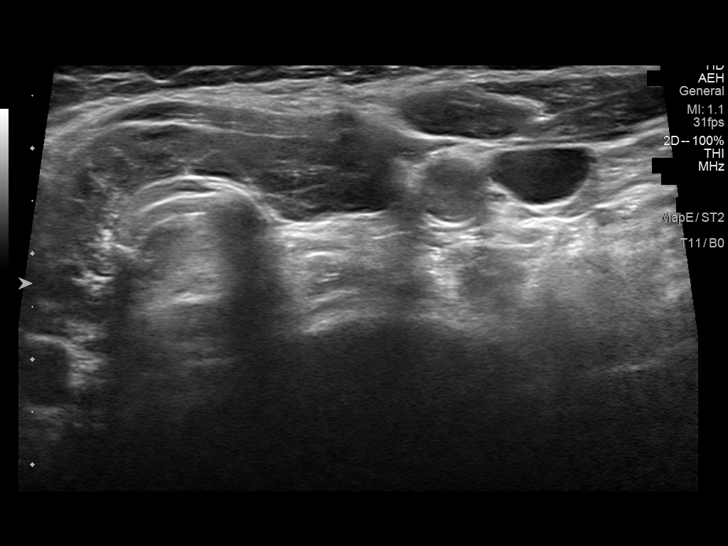
[im 35/35]
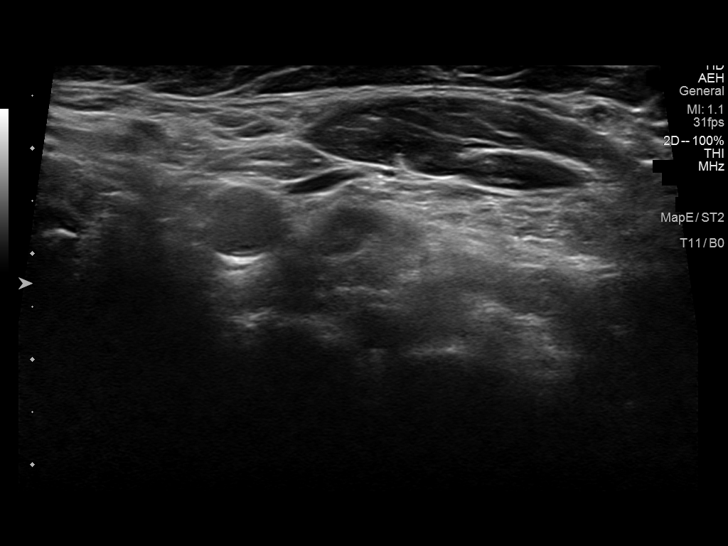

[14 of 25 positions shown; findings below may reference images not displayed]

FINDINGS: Parenchymal Echotexture: Markedly heterogenous

Isthmus: 0.6 cm thickness, previously

Right lobe: 4 x 1.8 x 1.3 cm, previously 5.2 x 1.7 x

Left lobe: 4.3 x 1.5 x 1.6 cm, previously 4.1 x 1.5 x

_________________________________________________________

Estimated total number of nodules >/= 1 cm: 0

Number of spongiform nodules >/=  2 cm not described below (TR1): 0

Number of mixed cystic and solid nodules >/= 1.5 cm not described
below (TR2): 0

_________________________________________________________

No discrete nodules are seen within the thyroid gland. No regional
cervical adenopathy.
IMPRESSION: 1. Heterogenous thyroid without nodule or other indication for
biopsy or imaging follow-up.

The above is in keeping with the ACR TI-RADS recommendations - [HOSPITAL] 6875;[DATE].

## 2023-02-06 ENCOUNTER — Other Ambulatory Visit: Payer: Self-pay | Admitting: Internal Medicine

## 2023-02-06 DIAGNOSIS — Z1231 Encounter for screening mammogram for malignant neoplasm of breast: Secondary | ICD-10-CM

## 2023-03-28 ENCOUNTER — Ambulatory Visit
Admission: RE | Admit: 2023-03-28 | Discharge: 2023-03-28 | Disposition: A | Discharge status: Home / Self Care | Payer: 59 | Source: Ambulatory Visit | Attending: Internal Medicine | Admitting: Internal Medicine

## 2023-03-28 DIAGNOSIS — Z1231 Encounter for screening mammogram for malignant neoplasm of breast: Secondary | ICD-10-CM

## 2023-08-02 ENCOUNTER — Encounter: Payer: Self-pay | Admitting: Obstetrics and Gynecology

## 2023-08-02 ENCOUNTER — Ambulatory Visit (INDEPENDENT_AMBULATORY_CARE_PROVIDER_SITE_OTHER): Payer: 59 | Admitting: Obstetrics and Gynecology

## 2023-08-02 VITALS — BP 116/74 | HR 90 | Temp 98.2°F

## 2023-08-02 DIAGNOSIS — N3001 Acute cystitis with hematuria: Secondary | ICD-10-CM

## 2023-08-02 DIAGNOSIS — R309 Painful micturition, unspecified: Secondary | ICD-10-CM | POA: Diagnosis not present

## 2023-08-02 DIAGNOSIS — R102 Pelvic and perineal pain: Secondary | ICD-10-CM

## 2023-08-02 MED ORDER — FLUCONAZOLE 150 MG PO TABS: 150.0000 mg | ORAL_TABLET | Freq: Once | ORAL | 0 refills | Status: AC

## 2023-08-02 MED ORDER — CEFTRIAXONE SODIUM 1 G IJ SOLR
1.0000 g | Freq: Once | INTRAMUSCULAR | Status: AC
  Administered 2023-08-02: 1 g via INTRAMUSCULAR

## 2023-08-02 MED ORDER — PHENAZOPYRIDINE HCL 200 MG PO TABS: 200.0000 mg | ORAL_TABLET | Freq: Three times a day (TID) | ORAL | 0 refills | Status: AC | PRN

## 2023-08-02 NOTE — Progress Notes (Signed)
57 y.o. y.o. female here for dysuria, pelvic pain.  She feels like labor cramp pains and pressure c/w a bowling ball in her pelvis. Started Saturday. Seen in urgent care and they started macrobid and called and said the culture was negative and she stopped. Pain became worse and is having a hard time voiding and burning and difficulty sitting down. No fevers or upper back pain.  Patient's last menstrual period was 06/29/2019.    There is no height or weight on file to calculate BMI.      No data to display          Blood pressure 116/74, pulse 90, temperature 98.2 F (36.8 C), temperature source Oral, last menstrual period 06/29/2019, SpO2 99%.     Component Value Date/Time   DIAGPAP  06/30/2022 1447    - Negative for intraepithelial lesion or malignancy (NILM)   DIAGPAP  01/03/2018 0000    NEGATIVE FOR INTRAEPITHELIAL LESIONS OR MALIGNANCY.   HPVHIGH Negative 06/30/2022 1447   ADEQPAP  06/30/2022 1447    Satisfactory for evaluation; transformation zone component PRESENT.   ADEQPAP  01/03/2018 0000    Satisfactory for evaluation. The presence or absence of an endocervical / transformation zone component cannot be determined because of atrophy.    GYN HISTORY:    Component Value Date/Time   DIAGPAP  06/30/2022 1447    - Negative for intraepithelial lesion or malignancy (NILM)   DIAGPAP  01/03/2018 0000    NEGATIVE FOR INTRAEPITHELIAL LESIONS OR MALIGNANCY.   HPVHIGH Negative 06/30/2022 1447   ADEQPAP  06/30/2022 1447    Satisfactory for evaluation; transformation zone component PRESENT.   ADEQPAP  01/03/2018 0000    Satisfactory for evaluation. The presence or absence of an endocervical / transformation zone component cannot be determined because of atrophy.    OB History  Gravida Para Term Preterm AB Living  2 2 2     2   SAB IAB Ectopic Multiple Live Births               # Outcome Date GA Lbr Len/2nd Weight Sex Type Anes PTL Lv  2 Term           1 Term              Past Medical History:  Diagnosis Date   Breast mass, right    carcinoma in situ   Genetic testing 01/26/2018   Multi-Cancer panel (83 genes) @ [facility] - No pathogenic mutations detected   GERD (gastroesophageal reflux disease)    Hypothyroidism    Thyroid disease    sees Dr. Talmage Nap    Past Surgical History:  Procedure Laterality Date   BREAST BIOPSY Right    BREAST EXCISIONAL BIOPSY Right 2016   no apparent scar   BREAST EXCISIONAL BIOPSY Left    BREAST LUMPECTOMY WITH RADIOACTIVE SEED LOCALIZATION Right 04/16/2015   Procedure: RIGHT BREAST LUMPECTOMY WITH RADIOACTIVE SEED LOCALIZATION;  Surgeon: Harriette Bouillon, MD;  Location: [facility];  Service: General;  Laterality: Right;   BREAST SURGERY     age 48--benign breast mass removed   lipoma removal  12/2009   left groin area--Dr. Tressie Stalker   LUMBAR DISC SURGERY  12/2013   --Dr. Marikay Alar    Current Outpatient Medications on File Prior to Visit  Medication Sig Dispense Refill   Ascorbic Acid (VITAMIN C PO) Take by mouth.     buPROPion (WELLBUTRIN XL) 150 MG 24 hr tablet Take 150 mg  by mouth every morning.     levothyroxine (SYNTHROID) 125 MCG tablet Take 1 tablet (125 mcg total) by mouth daily. One po qd 90 tablet 3   omeprazole (PRILOSEC) 40 MG capsule Take 40 mg by mouth every morning.     valACYclovir (VALTREX) 1000 MG tablet Take 2 tablets (2000 mg) by mouth every 12 hours x 2 doses prn cold sores. 30 tablet 1   No current facility-administered medications on file prior to visit.    Social History   Socioeconomic History   Marital status: Married    Spouse name: Not on file   Number of children: Not on file   Years of education: Not on file   Highest education level: Not on file  Occupational History   Not on file  Tobacco Use   Smoking status: Never   Smokeless tobacco: Never  Vaping Use   Vaping status: Never Used  Substance and Sexual Activity   Alcohol use: Not  Currently    Comment: social   Drug use: No   Sexual activity: Not Currently    Partners: Male    Birth control/protection: Abstinence  Other Topics Concern   Not on file  Social History Narrative   Not on file   Social Determinants of Health   Financial Resource Strain: Not on file  Food Insecurity: Not on file  Transportation Needs: Not on file  Physical Activity: Not on file  Stress: Not on file  Social Connections: Not on file  Intimate Partner Violence: Not on file    Family History  Problem Relation Age of Onset   Parkinson's disease Mother    Diabetes Father        AODM   Prostate cancer Father 22       deceased 45   Cancer Father 49       cancer unknown  origin   Heart attack Maternal Grandfather    Multiple myeloma Paternal Grandmother 65       deceased 49s   Cancer Paternal Uncle        unk. primary; deceaesd late 70s     No Known Allergies  UA with 2+leuks, 3+blood, 3+ protein 20-40 wbc   Patient's last menstrual period was Patient's last menstrual period was 06/29/2019.Marland Kitchen            Review of Systems Alls systems reviewed and are negative.    A:         UTI                             P:        [facility] sent Rocephin IM given today Pyridium sent to help reduce her pain.  Discussed to return with worsening or persistent s/s.  She agreed. Diflucan sent to prevent a yeast infection To get TV US to evaluate for any gyn causes of the pain as well.  Encouraged vaginal estrogen to lower risk of recurrent UTI's due to atrophic vaginitis  No follow-ups on file.  Earley Favor  spent on reviewing records, imaging,  and one on one patient time and counseling patient and documentation Dr. Karma Greaser

## 2023-08-02 NOTE — Progress Notes (Signed)
Patient was given 1 gram of rocephin. Patient waited 10 mins for observation. No symptoms. Patient left the office after

## 2023-08-03 ENCOUNTER — Other Ambulatory Visit: Payer: Self-pay | Admitting: *Deleted

## 2023-08-03 DIAGNOSIS — R309 Painful micturition, unspecified: Secondary | ICD-10-CM

## 2023-08-03 DIAGNOSIS — N3001 Acute cystitis with hematuria: Secondary | ICD-10-CM

## 2023-08-03 DIAGNOSIS — R102 Pelvic and perineal pain unspecified side: Secondary | ICD-10-CM

## 2023-08-03 LAB — URINALYSIS, COMPLETE W/RFL CULTURE
Bilirubin Urine: NEGATIVE
Glucose, UA: NEGATIVE
Hyaline Cast: NONE SEEN /LPF
Nitrites, Initial: NEGATIVE
Specific Gravity, Urine: >1.03 (ref 1.001–1.035)
pH: 5.5 (ref 5.0–8.0)

## 2023-08-03 LAB — URINE CULTURE
MICRO NUMBER:: 15837480
SPECIMEN QUALITY:: ADEQUATE

## 2023-08-03 LAB — CULTURE INDICATED

## 2023-08-04 ENCOUNTER — Encounter: Payer: Self-pay | Admitting: Obstetrics and Gynecology

## 2023-08-07 ENCOUNTER — Ambulatory Visit
Admission: RE | Admit: 2023-08-07 | Discharge: 2023-08-07 | Disposition: A | Discharge status: Home / Self Care | Payer: 59 | Source: Ambulatory Visit | Attending: Obstetrics and Gynecology | Admitting: Obstetrics and Gynecology

## 2023-08-07 DIAGNOSIS — R102 Pelvic and perineal pain: Secondary | ICD-10-CM

## 2023-08-07 DIAGNOSIS — R309 Painful micturition, unspecified: Secondary | ICD-10-CM

## 2023-08-07 DIAGNOSIS — N3001 Acute cystitis with hematuria: Secondary | ICD-10-CM

## 2023-08-08 ENCOUNTER — Encounter: Payer: Self-pay | Admitting: Obstetrics and Gynecology

## 2023-09-04 ENCOUNTER — Telehealth: Payer: Self-pay | Admitting: *Deleted

## 2023-09-04 NOTE — Telephone Encounter (Signed)
 Call returned to patient. Patient cancelled PUS scheduled for 09/05/23. Patient states symptoms have completely resolves, declines PUS at this time, will call if symptoms return.   PUS order cancelled.   Routing FYI.   Encounter closed.

## 2023-09-05 ENCOUNTER — Other Ambulatory Visit: Payer: 59

## 2023-09-05 ENCOUNTER — Other Ambulatory Visit: Payer: 59 | Admitting: Obstetrics and Gynecology

## 2024-04-29 ENCOUNTER — Other Ambulatory Visit: Payer: Self-pay | Admitting: Internal Medicine

## 2024-04-29 DIAGNOSIS — Z1231 Encounter for screening mammogram for malignant neoplasm of breast: Secondary | ICD-10-CM

## 2024-05-08 ENCOUNTER — Ambulatory Visit
Admission: RE | Admit: 2024-05-08 | Discharge: 2024-05-08 | Disposition: A | Discharge status: Home / Self Care | Source: Ambulatory Visit | Attending: Internal Medicine | Admitting: Internal Medicine

## 2024-05-08 DIAGNOSIS — Z1231 Encounter for screening mammogram for malignant neoplasm of breast: Secondary | ICD-10-CM

## 2024-05-13 ENCOUNTER — Other Ambulatory Visit: Payer: Self-pay | Admitting: Internal Medicine

## 2024-05-13 DIAGNOSIS — R928 Other abnormal and inconclusive findings on diagnostic imaging of breast: Secondary | ICD-10-CM

## 2024-05-20 ENCOUNTER — Ambulatory Visit
Admission: RE | Admit: 2024-05-20 | Discharge: 2024-05-20 | Disposition: A | Discharge status: Home / Self Care | Source: Ambulatory Visit | Attending: Internal Medicine | Admitting: Internal Medicine

## 2024-05-20 ENCOUNTER — Ambulatory Visit
Admission: RE | Admit: 2024-05-20 | Discharge: 2024-05-20 | Disposition: A | Source: Ambulatory Visit | Attending: Internal Medicine

## 2024-05-20 ENCOUNTER — Other Ambulatory Visit: Payer: Self-pay | Admitting: Internal Medicine

## 2024-05-20 DIAGNOSIS — R928 Other abnormal and inconclusive findings on diagnostic imaging of breast: Secondary | ICD-10-CM

## 2024-05-20 DIAGNOSIS — N6489 Other specified disorders of breast: Secondary | ICD-10-CM

## 2024-06-26 ENCOUNTER — Other Ambulatory Visit: Payer: Self-pay | Admitting: Obstetrics and Gynecology

## 2024-06-26 DIAGNOSIS — N644 Mastodynia: Secondary | ICD-10-CM

## 2024-07-15 ENCOUNTER — Ambulatory Visit

## 2024-07-15 ENCOUNTER — Ambulatory Visit
Admission: RE | Admit: 2024-07-15 | Discharge: 2024-07-15 | Disposition: A | Source: Ambulatory Visit | Attending: Obstetrics and Gynecology | Admitting: Obstetrics and Gynecology

## 2024-07-15 DIAGNOSIS — N644 Mastodynia: Secondary | ICD-10-CM
